# Patient Record
Sex: Female | Born: 1970 | Race: White | Hispanic: No | Marital: Married | Smoking: Never smoker
Health system: Southern US, Community
[De-identification: ages and names within clinical notes are randomized; demographics above are authoritative.]

## PROBLEM LIST (undated history)

## (undated) DIAGNOSIS — F419 Anxiety disorder, unspecified: Secondary | ICD-10-CM

## (undated) DIAGNOSIS — G4730 Sleep apnea, unspecified: Secondary | ICD-10-CM

## (undated) DIAGNOSIS — F329 Major depressive disorder, single episode, unspecified: Secondary | ICD-10-CM

## (undated) DIAGNOSIS — F32A Depression, unspecified: Secondary | ICD-10-CM

## (undated) DIAGNOSIS — N3010 Interstitial cystitis (chronic) without hematuria: Secondary | ICD-10-CM

## (undated) DIAGNOSIS — E039 Hypothyroidism, unspecified: Secondary | ICD-10-CM

## (undated) DIAGNOSIS — T8859XA Other complications of anesthesia, initial encounter: Secondary | ICD-10-CM

## (undated) DIAGNOSIS — F3181 Bipolar II disorder: Secondary | ICD-10-CM

## (undated) DIAGNOSIS — F909 Attention-deficit hyperactivity disorder, unspecified type: Secondary | ICD-10-CM

## (undated) DIAGNOSIS — J309 Allergic rhinitis, unspecified: Secondary | ICD-10-CM

## (undated) DIAGNOSIS — J45909 Unspecified asthma, uncomplicated: Secondary | ICD-10-CM

## (undated) HISTORY — PX: RADICAL HYSTERECTOMY: SHX2283

## (undated) HISTORY — DX: Bipolar II disorder: F31.81

## (undated) HISTORY — DX: Depression, unspecified: F32.A

## (undated) HISTORY — DX: Unspecified asthma, uncomplicated: J45.909

## (undated) HISTORY — PX: THYROIDECTOMY, PARTIAL: SHX18

## (undated) HISTORY — PX: LASIK: SHX215

## (undated) HISTORY — DX: Interstitial cystitis (chronic) without hematuria: N30.10

## (undated) HISTORY — PX: CYSTOCELE REPAIR: SHX163

## (undated) HISTORY — DX: Hypothyroidism, unspecified: E03.9

## (undated) HISTORY — DX: Allergic rhinitis, unspecified: J30.9

## (undated) HISTORY — DX: Major depressive disorder, single episode, unspecified: F32.9

## (undated) HISTORY — PX: VERTICAL BANDED GASTROPLASTY: SHX1102

---

## 2014-12-23 DIAGNOSIS — J45909 Unspecified asthma, uncomplicated: Secondary | ICD-10-CM | POA: Insufficient documentation

## 2014-12-23 DIAGNOSIS — E663 Overweight: Secondary | ICD-10-CM | POA: Insufficient documentation

## 2014-12-23 DIAGNOSIS — Z90710 Acquired absence of both cervix and uterus: Secondary | ICD-10-CM | POA: Insufficient documentation

## 2014-12-23 DIAGNOSIS — M545 Low back pain, unspecified: Secondary | ICD-10-CM | POA: Insufficient documentation

## 2014-12-23 DIAGNOSIS — Z86010 Personal history of colonic polyps: Secondary | ICD-10-CM | POA: Insufficient documentation

## 2014-12-23 DIAGNOSIS — K449 Diaphragmatic hernia without obstruction or gangrene: Secondary | ICD-10-CM | POA: Insufficient documentation

## 2014-12-23 DIAGNOSIS — Z9889 Other specified postprocedural states: Secondary | ICD-10-CM | POA: Insufficient documentation

## 2015-04-22 DIAGNOSIS — N3010 Interstitial cystitis (chronic) without hematuria: Secondary | ICD-10-CM | POA: Insufficient documentation

## 2016-01-13 DIAGNOSIS — M797 Fibromyalgia: Secondary | ICD-10-CM | POA: Insufficient documentation

## 2016-09-28 DIAGNOSIS — E890 Postprocedural hypothyroidism: Secondary | ICD-10-CM | POA: Insufficient documentation

## 2016-09-28 DIAGNOSIS — Z9884 Bariatric surgery status: Secondary | ICD-10-CM | POA: Insufficient documentation

## 2016-09-28 DIAGNOSIS — R6889 Other general symptoms and signs: Secondary | ICD-10-CM | POA: Insufficient documentation

## 2016-09-28 DIAGNOSIS — Z9889 Other specified postprocedural states: Secondary | ICD-10-CM | POA: Insufficient documentation

## 2017-12-29 ENCOUNTER — Other Ambulatory Visit: Payer: Self-pay | Admitting: Family Medicine

## 2017-12-30 ENCOUNTER — Other Ambulatory Visit: Payer: Self-pay | Admitting: Family Medicine

## 2017-12-30 DIAGNOSIS — N644 Mastodynia: Secondary | ICD-10-CM

## 2018-01-03 ENCOUNTER — Other Ambulatory Visit: Payer: Self-pay

## 2018-01-11 ENCOUNTER — Inpatient Hospital Stay: Admission: RE | Admit: 2018-01-11 | Payer: Self-pay | Source: Ambulatory Visit

## 2018-01-11 ENCOUNTER — Other Ambulatory Visit: Payer: Self-pay

## 2018-02-16 HISTORY — PX: CERVICAL DISC SURGERY: SHX588

## 2018-02-20 ENCOUNTER — Other Ambulatory Visit: Payer: Self-pay | Admitting: Family Medicine

## 2018-02-20 ENCOUNTER — Ambulatory Visit
Admission: RE | Admit: 2018-02-20 | Discharge: 2018-02-20 | Disposition: A | Discharge status: Home / Self Care | Payer: Managed Care, Other (non HMO) | Source: Ambulatory Visit | Attending: Family Medicine | Admitting: Family Medicine

## 2018-02-20 DIAGNOSIS — Z01818 Encounter for other preprocedural examination: Secondary | ICD-10-CM

## 2018-10-07 DIAGNOSIS — F988 Other specified behavioral and emotional disorders with onset usually occurring in childhood and adolescence: Secondary | ICD-10-CM | POA: Insufficient documentation

## 2018-10-07 DIAGNOSIS — F3132 Bipolar disorder, current episode depressed, moderate: Secondary | ICD-10-CM

## 2018-10-07 DIAGNOSIS — F4310 Post-traumatic stress disorder, unspecified: Secondary | ICD-10-CM

## 2018-10-16 ENCOUNTER — Ambulatory Visit: Payer: Self-pay | Admitting: Physician Assistant

## 2018-10-17 ENCOUNTER — Ambulatory Visit (INDEPENDENT_AMBULATORY_CARE_PROVIDER_SITE_OTHER): Payer: 59 | Admitting: Physician Assistant

## 2018-10-17 ENCOUNTER — Encounter: Payer: Self-pay | Admitting: Physician Assistant

## 2018-10-17 DIAGNOSIS — F900 Attention-deficit hyperactivity disorder, predominantly inattentive type: Secondary | ICD-10-CM | POA: Diagnosis not present

## 2018-10-17 DIAGNOSIS — F4310 Post-traumatic stress disorder, unspecified: Secondary | ICD-10-CM

## 2018-10-17 DIAGNOSIS — F319 Bipolar disorder, unspecified: Secondary | ICD-10-CM

## 2018-10-17 MED ORDER — BUPROPION HCL ER (XL) 300 MG PO TB24: 300.0000 mg | ORAL_TABLET | Freq: Every day | ORAL | 1 refills | Status: DC

## 2018-10-17 MED ORDER — OXCARBAZEPINE 300 MG PO TABS: ORAL_TABLET | ORAL | 1 refills | Status: DC

## 2018-10-17 MED ORDER — CITALOPRAM HYDROBROMIDE 40 MG PO TABS: 40.0000 mg | ORAL_TABLET | Freq: Every day | ORAL | 1 refills | Status: DC

## 2018-10-17 MED ORDER — AMPHETAMINE-DEXTROAMPHETAMINE 15 MG PO TABS: ORAL_TABLET | ORAL | 0 refills | Status: DC

## 2018-10-17 MED ORDER — LAMOTRIGINE 100 MG PO TABS: 100.0000 mg | ORAL_TABLET | Freq: Every day | ORAL | 1 refills | Status: DC

## 2018-10-17 NOTE — Patient Instructions (Signed)
Stop the Depakote  Start Trileptal (Oxcarbazepine) 300 mg at night for 4 nights, then one twice a day.  Increase Lamictal to 100mg  on 10/19/18  Increase the Adderall to 15 mg, 1 every morning and 1/2 in the afternoon if needed.   Continue the other meds the same.

## 2018-10-17 NOTE — Progress Notes (Signed)
[facility]  Patient ID: Kelli Pennington,  MRN: [id number]  PCP: Darrow Bussing, MD  Date of Evaluation: 10/17/2018 Time spent:15 minutes  Chief Complaint:  Chief Complaint    ADHD; Anxiety; Depression      HISTORY/CURRENT STATUS: HPIHere for 6 month med check.  Having trouble taking the Depakote. Pills are too big and they often get lodged in her throat.  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 She'll sometimes throw it up.  B/c she's had lap band surgery, she's unable to eat/drink a whole lot at once. She's tried the liquid in the past and it's nasty so won't go back to that.  Depakote sprinkles worked but she had to eat too much, so that's impossible.  Mood is worse than it was.  More tense and irritable, depressed at times.  She and husband just got back from [place] on vacation and enjoyed that. She'll avoid situations because she knows she's likely going to be irritable.  Energy and motivation are usually ok. But when has a depressed day, wants to go home and go to sleep.  At work, she stays to herself  And gets her work done.  She stayed home one day because she felt bad, which was partly depression.    Not really having PA but does have generalized anxiety.  Just feels hyper like she can't get past something and keeps thinking about it.   No SI/HI.  No AH/VH.  Past medications for mental health diagnoses include: Celexa, Wellbutrin, Klonopin, Prozac, Zoloft, Lexapro, Adderall, Lamictal, Depakote  Individual Medical History/ Review of Systems: Changes? :No   Allergies: Patient has no known allergies.  Current Medications:  Current Outpatient Medications:  .  buPROPion (WELLBUTRIN XL) 300 MG 24 hr tablet, Take 1 tablet (300 mg total) by mouth daily., Disp: 90 tablet, Rfl: 1 .  citalopram (CELEXA) 40 MG tablet, Take 1 tablet (40 mg total) by mouth daily., Disp: 90 tablet, Rfl: 1 .  amphetamine-dextroamphetamine (ADDERALL) 15 MG tablet, 1 po q am, and 1/2 in  the afternoon if needed., Disp: 45 tablet, Rfl: 0 .  [START ON 11/16/2018] amphetamine-dextroamphetamine (ADDERALL) 15 MG tablet, 1 po q am, and 1/2 in afternoon prn, Disp: 45 tablet, Rfl: 0 .  lamoTRIgine (LAMICTAL) 100 MG tablet, Take 1 tablet (100 mg total) by mouth daily., Disp: 30 tablet, Rfl: 1 .  Oxcarbazepine (TRILEPTAL) 300 MG tablet, 1 po q hs for 4 nights, then 1 po bid., Disp: 60 tablet, Rfl: 1 Medication Side Effects: none  Family Medical/ Social History: Changes? No  MENTAL HEALTH EXAM:  There were no vitals taken for this visit.There is no height or weight on file to calculate BMI.  General Appearance: Well Groomed  Eye Contact:  Good  Speech:  Clear and Coherent  Volume:  Normal  Mood:  Euthymic  Affect:  Appropriate  Thought Process:  Goal Directed  Orientation:  Full (Time, Place, and Person)  Thought Content: Logical   Suicidal Thoughts:  No  Homicidal Thoughts:  No  Memory:  WNL  Judgement:  Good  Insight:  Good  Psychomotor Activity:  Normal  Concentration:  Concentration: Good  Recall:  Good  Fund of Knowledge: Good  Language: Good  Assets:  Desire for Improvement  ADL's:  Intact  Cognition: WNL  Prognosis:  Good    DIAGNOSES:    ICD-10-CM   1. Bipolar I disorder (HCC) F31.9   2. PTSD (post-traumatic stress disorder) F43.10   3. Attention deficit hyperactivity  disorder (ADHD), predominantly inattentive type F90.0   Difficulty swallowing Depakote pills  Receiving Psychotherapy: No    RECOMMENDATIONS: Since she is having such a hard time swallowing the Depakote, and it sounds like she is not getting a consistent dose due to that fact, we will stop it and switch to Trileptal. Will increase Lamictal from 50 mg to 100 mg after she is off the Depakote for a couple of days. Continue Celexa. Increase Adderall dose as the current is not effective. Return in 6 weeks or sooner as needed.   Melony Overly, PA-C

## 2018-11-06 ENCOUNTER — Other Ambulatory Visit: Payer: Self-pay

## 2018-11-07 ENCOUNTER — Other Ambulatory Visit: Payer: Self-pay

## 2018-11-07 MED ORDER — DIVALPROEX SODIUM ER 500 MG PO TB24: 1000.0000 mg | ORAL_TABLET | Freq: Every day | ORAL | 1 refills | Status: DC

## 2018-11-12 ENCOUNTER — Other Ambulatory Visit: Payer: Self-pay

## 2018-11-12 MED ORDER — OXCARBAZEPINE 300 MG PO TABS: ORAL_TABLET | ORAL | 1 refills | Status: DC

## 2018-11-12 MED ORDER — LAMOTRIGINE 100 MG PO TABS: 100.0000 mg | ORAL_TABLET | Freq: Every day | ORAL | 1 refills | Status: DC

## 2018-11-28 ENCOUNTER — Ambulatory Visit (INDEPENDENT_AMBULATORY_CARE_PROVIDER_SITE_OTHER): Payer: 59 | Admitting: Physician Assistant

## 2018-11-28 ENCOUNTER — Encounter: Payer: Self-pay | Admitting: Physician Assistant

## 2018-11-28 DIAGNOSIS — F319 Bipolar disorder, unspecified: Secondary | ICD-10-CM

## 2018-11-28 DIAGNOSIS — F900 Attention-deficit hyperactivity disorder, predominantly inattentive type: Secondary | ICD-10-CM | POA: Diagnosis not present

## 2018-11-28 DIAGNOSIS — F4310 Post-traumatic stress disorder, unspecified: Secondary | ICD-10-CM | POA: Diagnosis not present

## 2018-11-28 MED ORDER — LAMOTRIGINE 100 MG PO TABS: 100.0000 mg | ORAL_TABLET | Freq: Every day | ORAL | 1 refills | Status: DC

## 2018-11-28 MED ORDER — BUPROPION HCL ER (XL) 300 MG PO TB24: 300.0000 mg | ORAL_TABLET | Freq: Every day | ORAL | 1 refills | Status: DC

## 2018-11-28 MED ORDER — CITALOPRAM HYDROBROMIDE 40 MG PO TABS: 40.0000 mg | ORAL_TABLET | Freq: Every day | ORAL | 1 refills | Status: DC

## 2018-11-28 MED ORDER — AMPHETAMINE-DEXTROAMPHETAMINE 20 MG PO TABS: ORAL_TABLET | ORAL | 0 refills | Status: DC

## 2018-11-28 MED ORDER — OXCARBAZEPINE 300 MG PO TABS: 300.0000 mg | ORAL_TABLET | Freq: Two times a day (BID) | ORAL | 1 refills | Status: DC

## 2018-11-28 NOTE — Progress Notes (Signed)
[facility]  Patient ID: Kelli Pennington,  MRN: [id number]  PCP: Darrow Bussing, MD  Date of Evaluation: 11/28/2018 Time spent:15 minutes  Chief Complaint:  Chief Complaint    Follow-up      HISTORY/CURRENT STATUS: HPI here for 6-week med check.  At last visit, we DC'd the Depakote and started Trileptal.  She is doing very well.  She is not gagging and having nausea secondary to taking those huge pills now.  Patient denies loss of interest in usual activities and is able to enjoy things.  Denies decreased energy or motivation.  Appetite has not changed.  No extreme sadness, tearfulness, or feelings of hopelessness.  Denies any changes in concentration, making decisions or remembering things.  Denies suicidal or homicidal thoughts.  Patient denies increased energy with decreased need for sleep, no increased talkativeness, no racing thoughts, no impulsivity or risky behaviors, no increased spending, no increased libido, no grandiosity.  As far as the ADHD goes, she used up the Adderall 20 mg that she has had in the past, taking that in the morning and then at 10 mg in the afternoon.  That seems to be the best coctail that she is done.  She is able to focus and complete task.  She is excited because she will be going on a cruise with a girlfriend in January.  States she is decided to take some time for herself and not worry about work so much.  Individual Medical History/ Review of Systems: Changes? :No   Allergies: Patient has no known allergies.  Current Medications:  Current Outpatient Medications:  .  buPROPion (WELLBUTRIN XL) 300 MG 24 hr tablet, Take 1 tablet (300 mg total) by mouth daily., Disp: 90 tablet, Rfl: 1 .  citalopram (CELEXA) 40 MG tablet, Take 1 tablet (40 mg total) by mouth daily., Disp: 90 tablet, Rfl: 1 .  lamoTRIgine (LAMICTAL) 100 MG tablet, Take 1 tablet (100 mg total) by mouth daily., Disp: 90 tablet, Rfl: 1 .  [START ON 12/28/2018]  amphetamine-dextroamphetamine (ADDERALL) 20 MG tablet, 1 po q am, 1/2 po in afternoon, Disp: 45 tablet, Rfl: 0 .  amphetamine-dextroamphetamine (ADDERALL) 20 MG tablet, 1 po q am, 1/2 q afternoon, Disp: 45 tablet, Rfl: 0 .  [START ON 01/27/2019] amphetamine-dextroamphetamine (ADDERALL) 20 MG tablet, 1 po q am, then 1/2 po q afternoon, Disp: 45 tablet, Rfl: 0 .  Oxcarbazepine (TRILEPTAL) 300 MG tablet, Take 1 tablet (300 mg total) by mouth 2 (two) times daily., Disp: 180 tablet, Rfl: 1 Medication Side Effects: none  Family Medical/ Social History: Changes? No  MENTAL HEALTH EXAM:  There were no vitals taken for this visit.There is no height or weight on file to calculate BMI.  General Appearance: Casual and Well Groomed  Eye Contact:  Good  Speech:  Clear and Coherent  Volume:  Normal  Mood:  Euthymic  Affect:  Appropriate  Thought Process:  Goal Directed  Orientation:  Full (Time, Place, and Person)  Thought Content: Logical   Suicidal Thoughts:  No  Homicidal Thoughts:  No  Memory:  WNL  Judgement:  Good  Insight:  Good  Psychomotor Activity:  Normal  Concentration:  Concentration: Good  Recall:  Good  Fund of Knowledge: Good  Language: Good  Assets:  Desire for Improvement  ADL's:  Intact  Cognition: WNL  Prognosis:  Good    DIAGNOSES:    ICD-10-CM   1. Bipolar I disorder (HCC) F31.9   2. PTSD (post-traumatic stress disorder) F43.10  3. Attention deficit hyperactivity disorder (ADHD), predominantly inattentive type F90.0     Receiving Psychotherapy: No    RECOMMENDATIONS: Continue Trileptal 300 mg p.o. twice daily Continue Adderall 20 mg in the a.m. and half p.o. at noon. Continue Lamictal 100 mg daily. Continue Celexa 40 mg daily. Continue Wellbutrin XL 1 daily. Return in 3 months or sooner as needed.   Melony Overlyeresa Soha Thorup, PA-C

## 2018-12-05 ENCOUNTER — Encounter: Payer: Self-pay | Admitting: Allergy

## 2018-12-05 ENCOUNTER — Ambulatory Visit (INDEPENDENT_AMBULATORY_CARE_PROVIDER_SITE_OTHER): Payer: 59 | Admitting: Allergy

## 2018-12-05 VITALS — BP 114/64 | HR 98 | Temp 99.0°F | Resp 20 | Ht 65.7 in | Wt 149.0 lb

## 2018-12-05 DIAGNOSIS — J3089 Other allergic rhinitis: Secondary | ICD-10-CM | POA: Diagnosis not present

## 2018-12-05 DIAGNOSIS — T781XXA Other adverse food reactions, not elsewhere classified, initial encounter: Secondary | ICD-10-CM | POA: Insufficient documentation

## 2018-12-05 DIAGNOSIS — T781XXD Other adverse food reactions, not elsewhere classified, subsequent encounter: Secondary | ICD-10-CM

## 2018-12-05 DIAGNOSIS — R059 Cough, unspecified: Secondary | ICD-10-CM

## 2018-12-05 DIAGNOSIS — R05 Cough: Secondary | ICD-10-CM | POA: Diagnosis not present

## 2018-12-05 DIAGNOSIS — T7819XA Other adverse food reactions, not elsewhere classified, initial encounter: Secondary | ICD-10-CM | POA: Insufficient documentation

## 2018-12-05 MED ORDER — FLUTICASONE PROPIONATE 50 MCG/ACT NA SUSP: NASAL | 5 refills | Status: AC

## 2018-12-05 MED ORDER — ALBUTEROL SULFATE HFA 108 (90 BASE) MCG/ACT IN AERS: 2.0000 | INHALATION_SPRAY | RESPIRATORY_TRACT | 1 refills | Status: AC | PRN

## 2018-12-05 MED ORDER — BUDESONIDE-FORMOTEROL FUMARATE 80-4.5 MCG/ACT IN AERO: 2.0000 | INHALATION_SPRAY | Freq: Two times a day (BID) | RESPIRATORY_TRACT | 5 refills | Status: AC

## 2018-12-05 NOTE — Patient Instructions (Addendum)
Today's testing showed: Positive to grass, dust mites, mold. Start Flonase 2 sprays once a day.  May use over the counter antihistamines such as Zyrtec (cetirizine), Claritin (loratadine), Allegra (fexofenadine), or Xyzal (levocetirizine) daily as needed.  Continue to avoid foods that cause you problems including the peanuts. Get bloodwork.  For mild symptoms you can take over the counter antihistamines such as Benadryl and monitor symptoms closely. If symptoms worsen or if you have severe symptoms including breathing issues, throat closure, significant swelling, whole body hives, severe diarrhea and vomiting, lightheadedness then seek immediate medical care.  . Daily controller medication(s): symbicort 80 2 puffs twice a day with spacer and rinse mouth afterwards. . Prior to physical activity: May use albuterol rescue inhaler 2 puffs 5 to 15 minutes prior to strenuous physical activities. Marland Kitchen. Rescue medications: May use albuterol rescue inhaler 2 puffs or nebulizer every 4 to 6 hours as needed for shortness of breath, chest tightness, coughing, and wheezing. Monitor frequency of use.  . Asthma control goals:  o Full participation in all desired activities (may need albuterol before activity) o Albuterol use two times or less a week on average (not counting use with activity) o Cough interfering with sleep two times or less a month o Oral steroids no more than once a year o No hospitalizations  Follow up in 2 months.  Reducing Pollen Exposure . Pollen seasons: trees (spring), grass (summer) and ragweed/weeds (fall). Marland Kitchen. Keep windows closed in your home and car to lower pollen exposure.  Kelli Pennington. Install air conditioning in the bedroom and throughout the house if possible.  . Avoid going out in dry windy days - especially early morning. . Pollen counts are highest between 5 - 10 AM and on dry, hot and windy days.  . Save outside activities for late afternoon or after a heavy rain, when pollen levels  are lower.  . Avoid mowing of grass if you have grass pollen allergy. Marland Kitchen. Be aware that pollen can also be transported indoors on people and pets.  . Dry your clothes in an automatic dryer rather than hanging them outside where they might collect pollen.  . Rinse hair and eyes before bedtime.  Control of House Dust Mite Allergen . Dust mite allergens are a common trigger of allergy and asthma symptoms. While they can be found throughout the house, these microscopic creatures thrive in warm, humid environments such as bedding, upholstered furniture and carpeting. . Because so much time is spent in the bedroom, it is essential to reduce mite levels there.  . Encase pillows, mattresses, and box springs in special allergen-proof fabric covers or airtight, zippered plastic covers.  . Bedding should be washed weekly in hot water (130 F) and dried in a hot dryer. Allergen-proof covers are available for comforters and pillows that can't be regularly washed.  Kelli Pennington. Wash the allergy-proof covers every few months. Minimize clutter in the bedroom. Keep pets out of the bedroom.  Marland Kitchen. Keep humidity less than 50% by using a dehumidifier or air conditioning. You can buy a humidity measuring device called a hygrometer to monitor this.  . If possible, replace carpets with hardwood, linoleum, or washable area rugs. If that's not possible, vacuum frequently with a vacuum that has a HEPA filter. . Remove all upholstered furniture and non-washable window drapes from the bedroom. . Remove all non-washable stuffed toys from the bedroom.  Wash stuffed toys weekly. Mold Control . Mold and fungi can grow on a variety of surfaces provided certain  temperature and moisture conditions exist.  . Outdoor molds grow on plants, decaying vegetation and soil. The major outdoor mold, Alternaria and Cladosporium, are found in very high numbers during hot and dry conditions. Generally, a late summer - fall peak is seen for common outdoor fungal  spores. Rain will temporarily lower outdoor mold spore count, but counts rise rapidly when the rainy period ends. . The most important indoor molds are Aspergillus and Penicillium. Dark, humid and poorly ventilated basements are ideal sites for mold growth. The next most common sites of mold growth are the bathroom and the kitchen. Outdoor (Seasonal) Mold Control . Use air conditioning and keep windows closed. . Avoid exposure to decaying vegetation. Marland Kitchen Avoid leaf raking. . Avoid grain handling. . Consider wearing a face mask if working in moldy areas.  Indoor (Perennial) Mold Control  . Maintain humidity below 50%. . Get rid of mold growth on hard surfaces with water, detergent and, if necessary, 5% bleach (do not mix with other cleaners). Then dry the area completely. If mold covers an area more than 10 square feet, consider hiring an indoor environmental professional. . For clothing, washing with soap and water is best. If moldy items cannot be cleaned and dried, throw them away. . Remove sources e.g. contaminated carpets. . Repair and seal leaking roofs or pipes. Using dehumidifiers in damp basements may be helpful, but empty the water and clean units regularly to prevent mildew from forming. All rooms, especially basements, bathrooms and kitchens, require ventilation and cleaning to deter mold and mildew growth. Avoid carpeting on concrete or damp floors, and storing items in damp areas. Pet Allergen Avoidance: . Contrary to popular opinion, there are no "hypoallergenic" breeds of dogs or cats. That is because people are not allergic to an animal's hair, but to an allergen found in the animal's saliva, dander (dead skin flakes) or urine. Pet allergy symptoms typically occur within minutes. For some people, symptoms can build up and become most severe 8 to 12 hours after contact with the animal. People with severe allergies can experience reactions in public places if dander has been transported on  the pet owners' clothing. Marland Kitchen Keeping an animal outdoors is only a partial solution, since homes with pets in the yard still have higher concentrations of animal allergens. . Before getting a pet, ask your allergist to determine if you are allergic to animals. If your pet is already considered part of your family, try to minimize contact and keep the pet out of the bedroom and other rooms where you spend a great deal of time. . As with dust mites, vacuum carpets often or replace carpet with a hardwood floor, tile or linoleum. . High-efficiency particulate air (HEPA) cleaners can reduce allergen levels over time. . While dander and saliva are the source of cat and dog allergens, urine is the source of allergens from rabbits, hamsters, mice and [place] pigs; so ask a non-allergic family member to clean the animal's cage. . If you have a pet allergy, talk to your allergist about the potential for allergy immunotherapy (allergy shots). This strategy can often provide long-term relief.

## 2018-12-05 NOTE — Progress Notes (Signed)
New Patient Note  RE: Kelli Pennington MRN: [id number] DOB: 11/16/1971 Date of Office Visit: 12/05/2018  Referring provider: Darrow Bussing, MD Primary care provider: Darrow Bussing, MD  Chief Complaint: Allergic Rhinitis ; Asthma (cough); and Allergic Reaction (feels sinus pressure when eating seafood and peanuts)  History of Present Illness: I had the pleasure of seeing Kelli Pennington for initial evaluation at the [facility] on 12/05/2018. She is a 47 y.o. female, who is referred here by Darrow Bussing, MD for the evaluation of asthma, allergic rhinitis, food intolerance.   Respiratory:  She reports symptoms of chest tightness, shortness of breath, coughing, wheezing, nocturnal awakenings for 15 years. Current medications include albuterol prn which help. She reports not using aerochamber with asthma inhalers. She tried the following inhalers: Advair Diskus many years ago with some benefit. Main asthma triggers are allergies, infections, smoke. In the last month, frequency of asthma symptoms: daily. Frequency of SABA use: daily. In the last 12 months, emergency room visits/urgent care visits/doctor office visits or hospitalizations due to asthma: no. In the last 12 months, oral steroids courses: no. Lifetime history of hospitalization for asthma: no. Prior intubations: no. History of pneumonia: yes, once in 2017. She was evaluated by allergist in the past. Smoking exposure: no. Up to date with flu vaccine: not yet.   Rhinitis: She reports symptoms of coughing, vertigo, nasal congestion, itchy/watery eyes. Symptoms have been going on for 40+ years. The symptoms are present all year around. Other triggers include exposure to pet dander. Anosmia: no. Headache: yes.  She has used OTC antihistamines, Flonase with minimal improvement in symptoms. Sinus infections: no. Previous work up includes: skin testing 15 years ago which showed multiple positives per patient report. She was on  allergy injections for about 1 year but she did not feel well on it so she stopped. Previous ENT evaluation: yes but not recently.  Food:  Patient states that when she eats peanuts it headaches and sinus tightness within a few minutes. Denies any other associated symptoms. Symptoms resolve within 1 day.  She does not have access to epinephrine autoinjector and not needed to use it.   Past work up includes: 15 year ago skin prick testing which showed positive to peanuts and certain fish per patient report Dietary History: patient has been eating other foods including 2% milk, eggs, rarely treenuts, sesame, shellfish, seafood, soy, wheat, meats, fruits and vegetables.  Assessment and Plan: [name] is a 47 y.o. female with: Coughing Respiratory symptoms for the past 15 years and has been using albuterol almost daily now with good benefit. No oral prednisone this year.   Today's spirometry showed: normal pattern with no improvement in FEV1 post bronchodilator treatment however clinically feeling improved.  Given clinical history will do a trial of Symbicort 80 2 puffs twice a day with spacer and rinse mouth afterwards. Demonstrated proper use and sample given. . Prior to physical activity: May use albuterol rescue inhaler 2 puffs 5 to 15 minutes prior to strenuous physical activities. Marland Kitchen Rescue medications: May use albuterol rescue inhaler 2 puffs or nebulizer every 4 to 6 hours as needed for shortness of breath, chest tightness, coughing, and wheezing. Monitor frequency of use.   Other allergic rhinitis Perennial rhinoconjunctivitis times the past 4 years.  Over-the-counter antihistamines and Flonase as needed with minimal benefit.  Testing over 15 years ago was positive to multiple items per patient report.  Also on allergy injections for about 1 year but had to  stop as she did not feel well on it.  No recent ENT evaluation.  Today's skin testing was positive to grass, dust mites and mold.   Discussed environmental control measures.  Start Flonase 2 sprays once a day.  May use over the counter antihistamines such as Zyrtec (cetirizine), Claritin (loratadine), Allegra (fexofenadine), or Xyzal (levocetirizine) daily as needed.  If above regimen does not control symptoms then will add Singulair as well.  Adverse food reaction Currently avoiding peanuts and certain seafood as they cause headaches and sinus tightness. Apparently 15 years ago she had skin testing was positive to these items. Records not available for review during OV.  Today's skin testing was negative to foods.  Discussed with patient that she may have some type of intolerance to these foods and recommend avoidance.  She would like to get bloodwork as well and ordered as below.  For mild symptoms you can take over the counter antihistamines such as Benadryl and monitor symptoms closely. If symptoms worsen or if you have severe symptoms including breathing issues, throat closure, significant swelling, whole body hives, severe diarrhea and vomiting, lightheadedness then seek immediate medical care.  Return in about 2 months (around 02/05/2019).  Meds ordered this encounter  Medications  . budesonide-formoterol (SYMBICORT) 80-4.5 MCG/ACT inhaler    Sig: Inhale 2 puffs into the lungs 2 (two) times daily. USE SPACER.    Dispense:  1 Inhaler    Refill:  5  . albuterol (PROVENTIL HFA;VENTOLIN HFA) 108 (90 Base) MCG/ACT inhaler    Sig: Inhale 2 puffs into the lungs every 4 (four) hours as needed for wheezing or shortness of breath.    Dispense:  18 g    Refill:  1  . fluticasone (FLONASE) 50 MCG/ACT nasal spray    Sig: Two sprays each nostril once a day as needed for nasal congestion or drainage.    Dispense:  16 g    Refill:  5    Lab Orders     Allergen Profile, Shellfish     Allergen Profile, Food-Fish     Peanut IgE     Allergen Profile, Nuts x7     Allergen Pistachio [phone]  Other allergy  screening: Medication allergy: no Hymenoptera allergy: no Urticaria: no Eczema:no History of recurrent infections suggestive of immunodeficency: no  Diagnostics: Spirometry:  Tracings reviewed. Her effort: Good reproducible efforts. FVC: 3.47L FEV1: 3.01L, 99% predicted FEV1/FVC ratio: 87% Interpretation: Spirometry consistent with normal pattern with no improvement in FEV1 post bronchodilator treatment however clinically feeling improved. Please see scanned spirometry results for details.  Skin Testing: Environmental allergy panel and select foods Positive test to: grass, dust mites, mold. Negative test to: foods.  Results discussed with patient/family. Airborne Adult Perc - 12/05/18 1459    Time Antigen Placed  1500    Allergen Manufacturer  Waynette Buttery    Location  Back    Number of Test  59    Panel 1  Select    1. Control-Buffer 50% Glycerol  Negative    2. Control-Histamine 1 mg/ml  3+    3. Albumin saline  Negative    4. [place]  4+    5. [place]  4+    6. Johnson  4+    7. [place] Blue  4+    8. Meadow Fescue  2+    9. Perennial Rye  4+    10. Sweet Vernal  4+    11. Timothy  4+    12. Cocklebur  Negative    13. Burweed Marshelder  Negative    14. Ragweed, short  Negative    15. Ragweed, Giant  Negative    16. Plantain,  English  Negative    17. Lamb's Quarters  Negative    18. Sheep Sorrell  Negative    19. Rough Pigweed  Negative    20. Marsh Elder, Rough  Negative    21. Mugwort, Common  Negative    22. Ash mix  Negative    23. Birch mix  Negative    24. Beech American  Negative    25. Box, Elder  Negative    26. Cedar, red  Negative    27. Cottonwood, Guinea-Bissau  Negative    28. Elm mix  Negative    29. Hickory mix  Negative    30. Maple mix  Negative    31. Oak, Guinea[place] mix  Negative    32. Pecan Pollen  Negative    33. Pine mix  Negative    34. Sycamore Eastern  Negative    35. Walnut, Black Pollen  Negative    36. Alternaria alternata  Negative    37.  Cladosporium Herbarum  Negative    38. Aspergillus mix  Negative    39. Penicillium mix  Negative    40. Bipolaris sorokiniana (Helminthosporium)  Negative    41. Drechslera spicifera (Curvularia)  Negative    42. Mucor plumbeus  Negative    43. Fusarium moniliforme  Negative    44. Aureobasidium pullulans (pullulara)  Negative    45. Rhizopus oryzae  Negative    46. Botrytis cinera  Negative    47. Epicoccum nigrum  Negative    48. Phoma betae  Negative    49. Candida Albicans  Negative    50. Trichophyton mentagrophytes  Negative    51. Mite, D Farinae  5,000 AU/ml  Negative    52. Mite, D Pteronyssinus  5,000 AU/ml  Negative    53. Cat Hair 10,000 BAU/ml  Negative    54.  Dog Epithelia  Negative    55. Mixed Feathers  Negative    56. Horse Epithelia  Negative    57. Cockroach, German  Negative    58. Mouse  Negative    59. Tobacco Leaf  Negative     Intradermal - 12/05/18 1538    Time Antigen Placed  1538    Allergen Manufacturer  Waynette Buttery    Location  Arm    Number of Test  12    Intradermal  Select    Control  Negative    Ragweed mix  Negative    Weed mix  Negative    Tree mix  Negative    Mold 1  Negative    Mold 2  2+    Mold 3  Negative    Mold 4  2+    Cat  Negative    Dog  Negative    Cockroach  Negative    Mite mix  2+     Food Adult Perc - 12/05/18 1500    Time Antigen Placed  1500    Allergen Manufacturer  Greer    Location  Back    Number of allergen test  30    1. Peanut  Negative    2. Soybean  Negative    3. Wheat  Negative    4. Sesame  Negative    5. Milk, cow  Negative    6. Egg [place],  Chicken  Negative    7. Casein  Negative    8. Shellfish Mix  Negative    9. Fish Mix  Negative    10. Cashew  Negative    11. Pecan Food  Negative    12. Walnut Food  Negative    13. Almond  Negative    14. Hazelnut  Negative    15. [place] nut  Negative    16. Coconut  Negative    17. Pistachio  Negative    18. Catfish  Negative    19. Bass  Negative      20. Trout  Negative    21. Tuna  Negative    22. Salmon  Negative    23. Flounder  Negative    24. Codfish  Negative    25. Shrimp  Negative    26. Crab  Negative    27. Lobster  Negative    28. Oyster  Negative    29. Scallops  Negative    3. Rabbit  Negative       Past Medical History: Patient Active Problem List   Diagnosis Date Noted  . Coughing 12/05/2018  . Other allergic rhinitis 12/05/2018  . Adverse food reaction 12/05/2018  . Posttraumatic stress disorder 10/07/2018  . Moderate depressed bipolar I disorder (HCC) 10/07/2018  . ADD (attention deficit disorder) 10/07/2018   Past Medical History:  Diagnosis Date  . Allergic rhinitis   . Asthma   . Bipolar 2 disorder (HCC)   . Depression   . Hypothyroidism   . Interstitial cystitis    Past Surgical History: Past Surgical History:  Procedure Laterality Date  . CERVICAL DISC SURGERY  02/2018  . RADICAL HYSTERECTOMY    . THYROIDECTOMY, PARTIAL    . VERTICAL BANDED GASTROPLASTY     Medication List:  Current Outpatient Medications  Medication Sig Dispense Refill  . albuterol (PROVENTIL HFA;VENTOLIN HFA) 108 (90 Base) MCG/ACT inhaler Inhale 2 puffs into the lungs every 4 (four) hours as needed for wheezing or shortness of breath. 18 g 1  . [START ON 12/28/2018] amphetamine-dextroamphetamine (ADDERALL) 20 MG tablet 1 po q am, 1/2 po in afternoon 45 tablet 0  . buPROPion (WELLBUTRIN XL) 300 MG 24 hr tablet Take 1 tablet (300 mg total) by mouth daily. 90 tablet 1  . citalopram (CELEXA) 40 MG tablet Take 1 tablet (40 mg total) by mouth daily. 90 tablet 1  . gabapentin (NEURONTIN) 300 MG capsule TAKE 1 CAPSULE BY MOUTH TWICE A DAY WITH FOOD  1  . lamoTRIgine (LAMICTAL) 100 MG tablet Take 1 tablet (100 mg total) by mouth daily. 90 tablet 1  . levothyroxine (SYNTHROID, LEVOTHROID) 88 MCG tablet TAKE 1 TABLET BY MOUTH EVERY DAY ON EMPTY STOMACH IN THE MORNING  2  . Oxcarbazepine (TRILEPTAL) 300 MG tablet Take 1 tablet (300  mg total) by mouth 2 (two) times daily. 180 tablet 1  . valACYclovir (VALTREX) 1000 MG tablet     . budesonide-formoterol (SYMBICORT) 80-4.5 MCG/ACT inhaler Inhale 2 puffs into the lungs 2 (two) times daily. USE SPACER. 1 Inhaler 5  . fluticasone (FLONASE) 50 MCG/ACT nasal spray Two sprays each nostril once a day as needed for nasal congestion or drainage. 16 g 5   No current facility-administered medications for this visit.    Allergies: No Known Allergies Social History: Social History   Socioeconomic History  . Marital status: Married    Spouse name: Not on file  . Number of children: Not  on file  . Years of education: Not on file  . Highest education level: Not on file  Occupational History  . Not on file  Social Needs  . Financial resource strain: Not on file  . Food insecurity:    Worry: Not on file    Inability: Not on file  . Transportation needs:    Medical: Not on file    Non-medical: Not on file  Tobacco Use  . Smoking status: Never Smoker  . Smokeless tobacco: Never Used  Substance and Sexual Activity  . Alcohol use: Yes    Comment: rare  . Drug use: Never  . Sexual activity: Not on file  Lifestyle  . Physical activity:    Days per week: Not on file    Minutes per session: Not on file  . Stress: Not on file  Relationships  . Social connections:    Talks on phone: Not on file    Gets together: Not on file    Attends religious service: Not on file    Active member of club or organization: Not on file    Attends meetings of clubs or organizations: Not on file    Relationship status: Not on file  Other Topics Concern  . Not on file  Social History Narrative  . Not on file   Lives in a 49 year old home. Smoking: denies Occupation: Biomedical scientist History: Immunologist in the house: no Engineer, civil (consulting) in the family room: no Carpet in the bedroom: yes Heating: electric Cooling: central Pet: yes 3 dogs indoors, 9 cat outdoors, 12  rabbits outdoors  Family History: Family History  Problem Relation Age of Onset  . COPD Mother   . Allergic rhinitis Sister   . Angioedema Neg Hx   . Asthma Neg Hx   . Eczema Neg Hx   . Immunodeficiency Neg Hx   . Urticaria Neg Hx    Review of Systems  Constitutional: Negative for appetite change, chills, fever and unexpected weight change.  HENT: Negative for congestion and rhinorrhea.   Eyes: Negative for itching.  Respiratory: Positive for cough, chest tightness, shortness of breath and wheezing.   Cardiovascular: Negative for chest pain.  Gastrointestinal: Positive for constipation. Negative for abdominal pain.  Genitourinary: Negative for difficulty urinating.  Skin: Negative for rash.  Allergic/Immunologic: Positive for environmental allergies and food allergies.  Neurological: Positive for headaches.   Objective: BP 114/64 (BP Location: Left Arm, Patient Position: Sitting, Cuff Size: Normal)   Pulse 98   Temp 99 F (37.2 C) (Oral)   Resp 20   Ht 5' 5.7" (1.669 m)   Wt 149 lb (67.6 kg)   SpO2 97%   BMI 24.27 kg/m  Body mass index is 24.27 kg/m. Physical Exam  Constitutional: She is oriented to person, place, and time. She appears well-developed and well-nourished.  HENT:  Head: Normocephalic and atraumatic.  Right Ear: External ear normal.  Left Ear: External ear normal.  Nose: Nose normal.  Mouth/Throat: Oropharynx is clear and moist.  Eyes: Conjunctivae and EOM are normal.  Neck: Neck supple.  Cardiovascular: Normal rate, regular rhythm and normal heart sounds. Exam reveals no gallop and no friction rub.  No murmur heard. Pulmonary/Chest: Effort normal and breath sounds normal. She has no wheezes. She has no rales.  Abdominal: Soft. Bowel sounds are normal. There is no abdominal tenderness.  Lymphadenopathy:    She has no cervical adenopathy.  Neurological: She is alert and oriented to person,  place, and time.  Skin: Skin is warm. No rash noted.    Psychiatric: She has a normal mood and affect. Her behavior is normal.  Nursing note and vitals reviewed.  The plan was reviewed with the patient/family, and all questions/concerned were addressed.  It was my pleasure to see Stanton KidneyDebra today and participate in her care. Please feel free to contact me with any questions or concerns.  Sincerely,  Wyline MoodYoon Kim, DO Allergy & Immunology  [facility] [place] office: [phone] [facility] office: [phone]

## 2018-12-05 NOTE — Assessment & Plan Note (Signed)
Respiratory symptoms for the past 15 years and has been using albuterol almost daily now with good benefit. No oral prednisone this year.   Today's spirometry showed: normal pattern with no improvement in FEV1 post bronchodilator treatment however clinically feeling improved.  Given clinical history will do a trial of Symbicort 80 2 puffs twice a day with spacer and rinse mouth afterwards. Demonstrated proper use and sample given. . Prior to physical activity: May use albuterol rescue inhaler 2 puffs 5 to 15 minutes prior to strenuous physical activities. Marland Kitchen. Rescue medications: May use albuterol rescue inhaler 2 puffs or nebulizer every 4 to 6 hours as needed for shortness of breath, chest tightness, coughing, and wheezing. Monitor frequency of use.

## 2018-12-05 NOTE — Assessment & Plan Note (Signed)
Currently avoiding peanuts and certain seafood as they cause headaches and sinus tightness. Apparently 15 years ago she had skin testing was positive to these items. Records not available for review during OV.  Today's skin testing was negative to foods.  Discussed with patient that she may have some type of intolerance to these foods and recommend avoidance.  She would like to get bloodwork as well and ordered as below.  For mild symptoms you can take over the counter antihistamines such as Benadryl and monitor symptoms closely. If symptoms worsen or if you have severe symptoms including breathing issues, throat closure, significant swelling, whole body hives, severe diarrhea and vomiting, lightheadedness then seek immediate medical care.

## 2018-12-05 NOTE — Assessment & Plan Note (Signed)
Perennial rhinoconjunctivitis times the past 4 years.  Over-the-counter antihistamines and Flonase as needed with minimal benefit.  Testing over 15 years ago was positive to multiple items per patient report.  Also on allergy injections for about 1 year but had to stop as she did not feel well on it.  No recent ENT evaluation.  Today's skin testing was positive to grass, dust mites and mold.  Discussed environmental control measures.  Start Flonase 2 sprays once a day.  May use over the counter antihistamines such as Zyrtec (cetirizine), Claritin (loratadine), Allegra (fexofenadine), or Xyzal (levocetirizine) daily as needed.  If above regimen does not control symptoms then will add Singulair as well.

## 2018-12-07 ENCOUNTER — Other Ambulatory Visit (HOSPITAL_COMMUNITY): Payer: Self-pay | Admitting: Interventional Radiology

## 2018-12-07 ENCOUNTER — Ambulatory Visit
Admission: RE | Admit: 2018-12-07 | Discharge: 2018-12-07 | Disposition: A | Discharge status: Home / Self Care | Payer: Self-pay | Source: Ambulatory Visit | Attending: Interventional Radiology | Admitting: Interventional Radiology

## 2018-12-07 DIAGNOSIS — R52 Pain, unspecified: Secondary | ICD-10-CM

## 2018-12-11 ENCOUNTER — Other Ambulatory Visit (HOSPITAL_COMMUNITY): Payer: Self-pay | Admitting: Interventional Radiology

## 2018-12-11 DIAGNOSIS — M5412 Radiculopathy, cervical region: Secondary | ICD-10-CM

## 2018-12-13 ENCOUNTER — Other Ambulatory Visit: Payer: Self-pay | Admitting: Radiology

## 2018-12-13 ENCOUNTER — Other Ambulatory Visit: Payer: Self-pay

## 2018-12-13 DIAGNOSIS — M47029 Vertebral artery compression syndromes, site unspecified: Secondary | ICD-10-CM

## 2018-12-14 ENCOUNTER — Ambulatory Visit (HOSPITAL_COMMUNITY)
Admission: RE | Admit: 2018-12-14 | Discharge: 2018-12-14 | Disposition: A | Discharge status: Home / Self Care | Payer: Managed Care, Other (non HMO) | Source: Ambulatory Visit | Attending: Interventional Radiology | Admitting: Interventional Radiology

## 2018-12-14 DIAGNOSIS — Z5309 Procedure and treatment not carried out because of other contraindication: Secondary | ICD-10-CM | POA: Insufficient documentation

## 2018-12-14 DIAGNOSIS — M5412 Radiculopathy, cervical region: Secondary | ICD-10-CM

## 2018-12-14 MED ORDER — SODIUM CHLORIDE 0.9 % IV SOLN: Freq: Once | INTRAVENOUS | Status: DC

## 2018-12-14 MED ORDER — LIDOCAINE-PRILOCAINE 2.5-2.5 % EX CREA
TOPICAL_CREAM | Freq: Once | CUTANEOUS | Status: DC
  Filled 2018-12-14: qty 5

## 2019-01-03 ENCOUNTER — Other Ambulatory Visit: Payer: Self-pay | Admitting: Radiology

## 2019-01-04 ENCOUNTER — Encounter (HOSPITAL_COMMUNITY): Payer: Self-pay

## 2019-01-04 ENCOUNTER — Ambulatory Visit (HOSPITAL_COMMUNITY): Admission: RE | Admit: 2019-01-04 | Payer: Managed Care, Other (non HMO) | Source: Ambulatory Visit

## 2019-01-14 ENCOUNTER — Other Ambulatory Visit: Payer: Self-pay | Admitting: Radiology

## 2019-01-15 ENCOUNTER — Ambulatory Visit (HOSPITAL_COMMUNITY)
Admission: RE | Admit: 2019-01-15 | Discharge: 2019-01-15 | Disposition: A | Discharge status: Home / Self Care | Payer: Managed Care, Other (non HMO) | Source: Ambulatory Visit | Attending: Interventional Radiology | Admitting: Interventional Radiology

## 2019-01-15 ENCOUNTER — Other Ambulatory Visit (HOSPITAL_COMMUNITY): Payer: Self-pay | Admitting: Interventional Radiology

## 2019-01-15 ENCOUNTER — Encounter (HOSPITAL_COMMUNITY): Payer: Self-pay

## 2019-01-15 DIAGNOSIS — M5412 Radiculopathy, cervical region: Secondary | ICD-10-CM

## 2019-01-15 DIAGNOSIS — F3181 Bipolar II disorder: Secondary | ICD-10-CM | POA: Insufficient documentation

## 2019-01-15 DIAGNOSIS — Z79890 Hormone replacement therapy (postmenopausal): Secondary | ICD-10-CM | POA: Insufficient documentation

## 2019-01-15 DIAGNOSIS — J45909 Unspecified asthma, uncomplicated: Secondary | ICD-10-CM | POA: Diagnosis not present

## 2019-01-15 DIAGNOSIS — Z79899 Other long term (current) drug therapy: Secondary | ICD-10-CM | POA: Insufficient documentation

## 2019-01-15 DIAGNOSIS — E039 Hypothyroidism, unspecified: Secondary | ICD-10-CM | POA: Insufficient documentation

## 2019-01-15 HISTORY — PX: IR US GUIDE VASC ACCESS RIGHT: IMG2390

## 2019-01-15 HISTORY — PX: IR ANGIO INTRA EXTRACRAN SEL COM CAROTID INNOMINATE BILAT MOD SED: IMG5360

## 2019-01-15 HISTORY — PX: IR ANGIO VERTEBRAL SEL VERTEBRAL BILAT MOD SED: IMG5369

## 2019-01-15 LAB — BASIC METABOLIC PANEL
Anion gap: 11 (ref 5–15)
BUN: 9 mg/dL (ref 6–20)
CO2: 26 mmol/L (ref 22–32)
Calcium: 9 mg/dL (ref 8.9–10.3)
Chloride: 101 mmol/L (ref 98–111)
Creatinine, Ser: 0.69 mg/dL (ref 0.44–1.00)
GFR calc Af Amer: >60 mL/min (ref >60)
Glucose, Bld: 90 mg/dL (ref 70–99)
Potassium: 4 mmol/L (ref 3.5–5.1)
Sodium: 138 mmol/L (ref 135–145)

## 2019-01-15 LAB — CBC
HCT: 37.6 % (ref 36.0–46.0)
Hemoglobin: 13 g/dL (ref 12.0–15.0)
MCH: 32 pg (ref 26.0–34.0)
MCHC: 34.6 g/dL (ref 30.0–36.0)
MCV: 92.6 fL (ref 80.0–100.0)
Platelets: 242 10*3/uL (ref 150–400)
RBC: 4.06 MIL/uL (ref 3.87–5.11)
RDW: 11.8 % (ref 11.5–15.5)
WBC: 5.1 10*3/uL (ref 4.0–10.5)
nRBC: 0 % (ref 0.0–0.2)

## 2019-01-15 LAB — PROTIME-INR
INR: 0.94
Prothrombin Time: 12.5 seconds (ref 11.4–15.2)

## 2019-01-15 MED ORDER — IOHEXOL 300 MG/ML  SOLN
150.0000 mL | Freq: Once | INTRAMUSCULAR | Status: AC | PRN
  Administered 2019-01-15: 75 mL via INTRA_ARTERIAL

## 2019-01-15 MED ORDER — FENTANYL CITRATE (PF) 100 MCG/2ML IJ SOLN
INTRAMUSCULAR | Status: AC | PRN
  Administered 2019-01-15 (×2): 12.5 ug via INTRAVENOUS
  Administered 2019-01-15: 25 ug via INTRAVENOUS

## 2019-01-15 MED ORDER — HEPARIN SODIUM (PORCINE) 1000 UNIT/ML IJ SOLN
INTRAMUSCULAR | Status: AC
  Filled 2019-01-15: qty 1

## 2019-01-15 MED ORDER — NITROGLYCERIN 1 MG/10 ML FOR IR/CATH LAB
INTRA_ARTERIAL | Status: AC
  Filled 2019-01-15: qty 10

## 2019-01-15 MED ORDER — MIDAZOLAM HCL 2 MG/2ML IJ SOLN
INTRAMUSCULAR | Status: AC | PRN
  Administered 2019-01-15 (×2): 0.5 mg via INTRAVENOUS

## 2019-01-15 MED ORDER — SODIUM CHLORIDE 0.9 % IV SOLN: INTRAVENOUS | Status: AC

## 2019-01-15 MED ORDER — LIDOCAINE HCL 1 % IJ SOLN
INTRAMUSCULAR | Status: AC
  Filled 2019-01-15: qty 20

## 2019-01-15 MED ORDER — FENTANYL CITRATE (PF) 100 MCG/2ML IJ SOLN
INTRAMUSCULAR | Status: AC
  Filled 2019-01-15: qty 2

## 2019-01-15 MED ORDER — IOPAMIDOL (ISOVUE-300) INJECTION 61%
INTRAVENOUS | Status: AC
  Filled 2019-01-15: qty 50

## 2019-01-15 MED ORDER — SODIUM CHLORIDE 0.9 % IV SOLN
INTRAVENOUS | Status: AC | PRN
  Administered 2019-01-15: 250 mL via INTRAVENOUS

## 2019-01-15 MED ORDER — VERAPAMIL HCL 2.5 MG/ML IV SOLN
INTRAVENOUS | Status: AC
  Filled 2019-01-15: qty 2

## 2019-01-15 MED ORDER — SODIUM CHLORIDE 0.9 % IV SOLN: Freq: Once | INTRAVENOUS | Status: DC

## 2019-01-15 MED ORDER — MIDAZOLAM HCL 2 MG/2ML IJ SOLN
INTRAMUSCULAR | Status: AC
  Filled 2019-01-15: qty 2

## 2019-01-15 NOTE — Discharge Instructions (Signed)
Moderate Conscious Sedation, Adult, Care After °These instructions provide you with information about caring for yourself after your procedure. Your health care provider may also give you more specific instructions. Your treatment has been planned according to current medical practices, but problems sometimes occur. Call your health care provider if you have any problems or questions after your procedure. °What can I expect after the procedure? °After your procedure, it is common: °· To feel sleepy for several hours. °· To feel clumsy and have poor balance for several hours. °· To have poor judgment for several hours. °· To vomit if you eat too soon. °Follow these instructions at home: °For at least 24 hours after the procedure: ° °· Do not: °? Participate in activities where you could fall or become injured. °? Drive. °? Use heavy machinery. °? Drink alcohol. °? Take sleeping pills or medicines that cause drowsiness. °? Make important decisions or sign legal documents. °? Take care of children on your own. °· Rest. °Eating and drinking °· Follow the diet recommended by your health care provider. °· If you vomit: °? Drink water, juice, or soup when you can drink without vomiting. °? Make sure you have little or no nausea before eating solid foods. °General instructions °· Have a responsible adult stay with you until you are awake and alert. °· Take over-the-counter and prescription medicines only as told by your health care provider. °· If you smoke, do not smoke without supervision. °· Keep all follow-up visits as told by your health care provider. This is important. °Contact a health care provider if: °· You keep feeling nauseous or you keep vomiting. °· You feel light-headed. °· You develop a rash. °· You have a fever. °Get help right away if: °· You have trouble breathing. °This information is not intended to replace advice given to you by your health care provider. Make sure you discuss any questions you have  with your health care provider. °Document Released: 09/25/2013 Document Revised: 05/09/2016 Document Reviewed: 03/26/2016 °Elsevier Interactive Patient Education © 2019 [employer] °Radial Site Care ° °This sheet gives you information about how to care for yourself after your procedure. Your health care provider may also give you more specific instructions. If you have problems or questions, contact your health care provider. °What can I expect after the procedure? °After the procedure, it is common to have: °· Bruising and tenderness at the catheter insertion area. °Follow these instructions at home: °Medicines °· Take over-the-counter and prescription medicines only as told by your health care provider. °Insertion site care °· Follow instructions from your health care provider about how to take care of your insertion site. Make sure you: °? Wash your hands with soap and water before you change your bandage (dressing). If soap and water are not available, use hand sanitizer. °? Change your dressing as told by your health care provider. °? Leave stitches (sutures), skin glue, or adhesive strips in place. These skin closures may need to stay in place for 2 weeks or longer. If adhesive strip edges start to loosen and curl up, you may trim the loose edges. Do not remove adhesive strips completely unless your health care provider tells you to do that. °· Check your insertion site every day for signs of infection. Check for: °? Redness, swelling, or pain. °? Fluid or blood. °? Pus or a bad smell. °? Warmth. °· Do not take baths, swim, or use a hot tub until your health care provider approves. °· You   may shower 24-48 hours after the procedure, or as directed by your health care provider. °? Remove the dressing and gently wash the site with plain soap and water. °? Pat the area dry with a clean towel. °? Do not rub the site. That could cause bleeding. °· Do not apply powder or lotion to the site. °Activity ° °· For 24  hours after the procedure, or as directed by your health care provider: °? Do not flex or bend the affected arm. °? Do not push or pull heavy objects with the affected arm. °? Do not drive yourself home from the hospital or clinic. You may drive 24 hours after the procedure unless your health care provider tells you not to. °? Do not operate machinery or power tools. °· Do not lift anything that is heavier than 10 lb (4.5 kg), or the limit that you are told, until your health care provider says that it is safe. °· Ask your health care provider when it is okay to: °? Return to work or school. °? Resume usual physical activities or sports. °? Resume sexual activity. °General instructions °· If the catheter site starts to bleed, raise your arm and put firm pressure on the site. If the bleeding does not stop, get help right away. This is a medical emergency. °· If you went home on the same day as your procedure, a responsible adult should be with you for the first 24 hours after you arrive home. °· Keep all follow-up visits as told by your health care provider. This is important. °Contact a health care provider if: °· You have a fever. °· You have redness, swelling, or yellow drainage around your insertion site. °Get help right away if: °· You have unusual pain at the radial site. °· The catheter insertion area swells very fast. °· The insertion area is bleeding, and the bleeding does not stop when you hold steady pressure on the area. °· Your arm or hand becomes pale, cool, tingly, or numb. °These symptoms may represent a serious problem that is an emergency. Do not wait to see if the symptoms will go away. Get medical help right away. Call your local emergency services (911 in the [place] Do not drive yourself to the hospital. °Summary °· After the procedure, it is common to have bruising and tenderness at the site. °· Follow instructions from your health care provider about how to take care of your radial site wound.  Check the wound every day for signs of infection. °· Do not lift anything that is heavier than 10 lb (4.5 kg), or the limit that you are told, until your health care provider says that it is safe. °This information is not intended to replace advice given to you by your health care provider. Make sure you discuss any questions you have with your health care provider. °Document Released: 01/07/2011 Document Revised: 01/10/2018 Document Reviewed: 01/10/2018 °Elsevier Interactive Patient Education © 2019 Else[employer] Inc[employer] ° °

## 2019-01-15 NOTE — H&P (Signed)
Chief Complaint: Cervical radiculopathy  Referring Physician(s): Glynis SmilesAmanda Ward, PA-C  Supervising Physician: Julieanne Cottoneveshwar, Sanjeev  [facility]  History of Present Illness: Kelli GuardDebra Pennington is a 48 y.o. female who is being worked up for cervical radiculopathy.  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  She is here today for diagnostic cerebral angiography.  She is NPO. She feels well today. No nausea/vomiting. No Fever/chills. ROS negative.    Past Medical History:  Diagnosis Date  . Allergic rhinitis   . Asthma   . Bipolar 2 disorder (HCC)   . Depression   . Hypothyroidism   . Interstitial cystitis     Past Surgical History:  Procedure Laterality Date  . CERVICAL DISC SURGERY  02/2018  . RADICAL HYSTERECTOMY    . THYROIDECTOMY, PARTIAL    . VERTICAL BANDED GASTROPLASTY      Allergies: Patient has no known allergies.  Medications: Prior to Admission medications   Medication Sig Start Date End Date Taking? Authorizing Provider  albuterol (PROVENTIL HFA;VENTOLIN HFA) 108 (90 Base) MCG/ACT inhaler Inhale 2 puffs into the lungs every 4 (four) hours as needed for wheezing or shortness of breath. 12/05/18  Yes Ellamae SiaKim, Yoon M, DO  amphetamine-dextroamphetamine (ADDERALL) 20 MG tablet 1 po q am, 1/2 po in afternoon Patient taking differently: Take 10-20 mg by mouth See admin instructions. 20 mg in the morning, and 10 mg in the afternoon if needed 12/28/18  Yes Hurst, Teresa T, PA-C  budesonide-formoterol (SYMBICORT) 80-4.5 MCG/ACT inhaler Inhale 2 puffs into the lungs 2 (two) times daily. USE SPACER. Patient taking differently: Inhale 2 puffs into the lungs 2 (two) times daily as needed (asthma). USE SPACER. 12/05/18  Yes Ellamae SiaKim, Yoon M, DO  buPROPion  (WELLBUTRIN XL) 300 MG 24 hr tablet Take 1 tablet (300 mg total) by mouth daily. 11/28/18  Yes Hurst, Glade Nurseeresa T, PA-C  citalopram (CELEXA) 40 MG tablet Take 1 tablet (40 mg total) by mouth daily. 11/28/18  Yes Hurst, Teresa T, PA-C  fluticasone (FLONASE) 50 MCG/ACT nasal spray Two sprays each nostril once a day as needed for nasal congestion or drainage. 12/05/18  Yes Ellamae SiaKim, Yoon M, DO  gabapentin (NEURONTIN) 300 MG capsule Take 300 mg by mouth 2 (two) times daily.  10/23/18  Yes [provider]  lamoTRIgine (LAMICTAL) 100 MG tablet Take 1 tablet (100 mg total) by mouth daily. Patient taking differently: Take 100 mg by mouth at bedtime.  11/28/18  Yes Melony OverlyHurst, Teresa T, PA-C  levothyroxine (SYNTHROID, LEVOTHROID) 88 MCG tablet Take 88 mcg by mouth daily before breakfast.  10/05/18  Yes [provider]  Oxcarbazepine (TRILEPTAL) 300 MG tablet Take 1 tablet (300 mg total) by mouth 2 (two) times daily. Patient taking differently: Take 600 mg by mouth at bedtime.  11/28/18  Yes Hurst, Glade Nurseeresa T, PA-C  valACYclovir (VALTREX) 1000 MG tablet Take 1,000 mg by mouth daily as needed.  11/27/18  Yes [provider]     Family History  Problem Relation Age of Onset  . COPD Mother   . Allergic rhinitis Sister   . Angioedema Neg Hx   . Asthma Neg Hx   . Eczema Neg Hx   . Immunodeficiency Neg Hx   . Urticaria Neg Hx     Social History   Socioeconomic  History  . Marital status: Married    Spouse name: Not on file  . Number of children: Not on file  . Years of education: Not on file  . Highest education level: Not on file  Occupational History  . Not on file  Social Needs  . Financial resource strain: Not on file  . Food insecurity:    Worry: Not on file    Inability: Not on file  . Transportation needs:    Medical: Not on file    Non-medical: Not on file  Tobacco Use  . Smoking status: Never Smoker  . Smokeless tobacco: Never Used  Substance and Sexual Activity  .  Alcohol use: Yes    Comment: rare  . Drug use: Never  . Sexual activity: Not on file  Lifestyle  . Physical activity:    Days per week: Not on file    Minutes per session: Not on file  . Stress: Not on file  Relationships  . Social connections:    Talks on phone: Not on file    Gets together: Not on file    Attends religious service: Not on file    Active member of club or organization: Not on file    Attends meetings of clubs or organizations: Not on file    Relationship status: Not on file  Other Topics Concern  . Not on file  Social History Narrative  . Not on file     Review of Systems: A 12 point ROS discussed and pertinent positives are indicated in the HPI above.  All other systems are negative.  Review of Systems  Vital Signs: BP 120/79   Pulse 81   Temp 97.9 F (36.6 C) (Oral)   Resp 16   Ht 5' 5.5" (1.664 m)   Wt 63.5 kg   LMP 12/14/2018 Comment: hysterectomy  SpO2 100%   BMI 22.94 kg/m   Physical Exam Vitals signs reviewed.  Constitutional:      Appearance: Normal appearance.  HENT:     Head: Normocephalic and atraumatic.  Eyes:     Extraocular Movements: Extraocular movements intact.  Neck:     Musculoskeletal: Normal range of motion.  Cardiovascular:     Rate and Rhythm: Normal rate and regular rhythm.  Pulmonary:     Effort: Pulmonary effort is normal.     Breath sounds: Normal breath sounds.  Abdominal:     Palpations: Abdomen is soft.  Musculoskeletal: Normal range of motion.  Skin:    General: Skin is warm and dry.  Neurological:     General: No focal deficit present.     Mental Status: She is alert and oriented to person, place, and time.  Psychiatric:        Mood and Affect: Mood normal.        Behavior: Behavior normal.        Thought Content: Thought content normal.        Judgment: Judgment normal.     Imaging: No results found.  Labs:  CBC: Recent Labs    01/15/19 0648  WBC 5.1  HGB 13.0  HCT 37.6  PLT 242     COAGS: Recent Labs    01/15/19 0648  INR 0.94    BMP: Recent Labs    01/15/19 0648  NA 138  K 4.0  CL 101  CO2 26  GLUCOSE 90  BUN 9  CALCIUM 9.0  CREATININE 0.69  GFRNONAA >60  GFRAA >60    LIVER  FUNCTION TESTS: No results for input(s): BILITOT, AST, ALT, ALKPHOS, PROT, ALBUMIN in the last 8760 hours.  TUMOR MARKERS: No results for input(s): AFPTM, CEA, CA199, CHROMGRNA in the last 8760 hours.  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  Will proceed with diagnostic angiography today by Dr. Corliss Skains.  Risks and benefits of cerebral angiogram with intervention were discussed with the patient including, but not limited to bleeding, infection, vascular injury, contrast induced renal failure, stroke or even death.  This interventional procedure involves the use of X-rays and because of the nature of the planned procedure, it is possible that we will have prolonged use of X-ray fluoroscopy.  Potential radiation risks to you include (but are not limited to) the following: - A slightly elevated risk for cancer  several years later in life. This risk is typically less than 0.5% percent. This risk is low in comparison to the normal incidence of human cancer, which is 33% for women and 50% for men according to the [employer]. - Radiation induced injury can include skin redness, resembling a rash, tissue breakdown / ulcers and hair loss (which can be temporary or permanent).   The likelihood of either of these occurring depends on the difficulty of the procedure and whether you are sensitive to radiation due to previous procedures, disease, or genetic conditions.   IF your procedure requires a prolonged use of radiation, you will be notified and given written  instructions for further action.  It is your responsibility to monitor the irradiated area for the 2 weeks following the procedure and to notify your physician if you are concerned that you have suffered a radiation induced injury.    All of the patient's questions were answered, patient is agreeable to proceed.  Consent signed and in chart.  Thank you for this interesting consult.  I greatly enjoyed meeting Artemisa Mustafa and look forward to participating in their care.  A copy of this report was sent to the requesting provider on this date.  Electronically Signed: Gwynneth Macleod, PA-C   01/15/2019, 7:50 AM      I spent a total of  30 Minutes in face to face in clinical consultation, greater than 50% of which was counseling/coordinating care for cerebral angiography.

## 2019-01-15 NOTE — Procedures (Signed)
S/P 4 vessel cerebral arteriogram RT CFA approach  Findings. 1.No angio evidence of occlusions,stenosis ,intraluminal filling defects,AV shunting  or dissection.

## 2019-01-16 ENCOUNTER — Encounter (HOSPITAL_COMMUNITY): Payer: Self-pay | Admitting: Interventional Radiology

## 2019-01-17 ENCOUNTER — Encounter (HOSPITAL_COMMUNITY): Payer: Self-pay

## 2019-01-17 ENCOUNTER — Encounter: Payer: Managed Care, Other (non HMO) | Admitting: Vascular Surgery

## 2019-01-17 ENCOUNTER — Encounter (HOSPITAL_COMMUNITY): Payer: Managed Care, Other (non HMO)

## 2019-01-17 ENCOUNTER — Other Ambulatory Visit (HOSPITAL_COMMUNITY): Payer: Self-pay | Admitting: Interventional Radiology

## 2019-01-17 DIAGNOSIS — M5412 Radiculopathy, cervical region: Secondary | ICD-10-CM

## 2019-01-28 ENCOUNTER — Telehealth: Payer: Self-pay | Admitting: Physician Assistant

## 2019-01-28 NOTE — Telephone Encounter (Signed)
Have her increase Lamictal to 150mg  qhs until we see each other. Stay on Trileptal for now.

## 2019-01-28 NOTE — Telephone Encounter (Signed)
Pt reports that Oxycarbabmezepine is not helping with depression. She is taking it as prescribed but notices that depression has gotten worse and notices more ups and downs.

## 2019-01-28 NOTE — Telephone Encounter (Signed)
Please review message

## 2019-01-29 NOTE — Telephone Encounter (Signed)
Pt given information and verbalized understanding. Instructed to call back when needing a refill of medication.

## 2019-02-08 ENCOUNTER — Ambulatory Visit: Payer: 59 | Admitting: Allergy

## 2019-02-27 ENCOUNTER — Ambulatory Visit: Payer: 59 | Admitting: Physician Assistant

## 2019-03-13 ENCOUNTER — Encounter: Payer: Self-pay | Admitting: Physician Assistant

## 2019-03-13 ENCOUNTER — Ambulatory Visit (INDEPENDENT_AMBULATORY_CARE_PROVIDER_SITE_OTHER): Payer: 59 | Admitting: Physician Assistant

## 2019-03-13 ENCOUNTER — Other Ambulatory Visit: Payer: Self-pay

## 2019-03-13 DIAGNOSIS — F900 Attention-deficit hyperactivity disorder, predominantly inattentive type: Secondary | ICD-10-CM

## 2019-03-13 DIAGNOSIS — F319 Bipolar disorder, unspecified: Secondary | ICD-10-CM | POA: Diagnosis not present

## 2019-03-13 DIAGNOSIS — F4310 Post-traumatic stress disorder, unspecified: Secondary | ICD-10-CM | POA: Diagnosis not present

## 2019-03-13 MED ORDER — AMPHETAMINE-DEXTROAMPHETAMINE 20 MG PO TABS: 20.0000 mg | ORAL_TABLET | Freq: Two times a day (BID) | ORAL | 0 refills | Status: DC

## 2019-03-13 NOTE — Progress Notes (Signed)
[facility] Check  Patient ID: Kelli Pennington,  MRN: [id number]  PCP: Darrow Bussing, MD  Date of Evaluation: 03/13/2019 Time spent:15 minutes  Chief Complaint:  Chief Complaint    Anxiety; Follow-up; Depression     Virtual Visit via Telephone Note  I connected with Kelli Pennington on 03/13/19 at  9:00 AM EDT by telephone and verified that I am speaking with the correct person using two identifiers.   I discussed the limitations, risks, security and privacy concerns of performing an evaluation and management service by telephone and the availability of in person appointments. I also discussed with the patient that there may be a patient responsible charge related to this service. The patient expressed understanding and agreed to proceed.   The patient was advised to call back or seek an in-person evaluation if the symptoms worsen or if the condition fails to improve as anticipated.  I provided 15 minutes of non-face-to-face time during this encounter.   Melony Overly, PA-C   HISTORY/CURRENT STATUS: HPI For 3 month med check.  Feels more depressed lately.  Can hit suddenly, and associated w/ bad anxiety. A few weeks ago, was in [place] for work and got upset about something and couldn't calm down for about 3 days.  No PA, but felt uneasy. Depression can last only a few hours or a day or so and usually not triggered.  No SI/HI.  Has low energy and motivation worse in the afternoon, and "now that I think about it, the depression hits in the afternoon as well.  It is almost like the Adderall is not working as well anymore."  Reports decreased concentration is in the afternoon so she pushes herself to get more things done in the morning, which causes more anxiety.  Patient denies increased energy with decreased need for sleep, no increased talkativeness, no racing thoughts, no impulsivity or risky behaviors, no increased spending, no increased libido, no grandiosity.  States she had  stopped the gabapentin because she was nervous about long-term effects.  She has noticed that she is having more pain from fibromyalgia and with her neck and back, which the gabapentin was originally prescribed for.  Denies muscle or joint pain, stiffness, or dystonia.  Denies dizziness, syncope, seizures, numbness, tingling, tremor, tics, unsteady gait, slurred speech, confusion.   Individual Medical History/ Review of Systems: Changes? :No    Past medications for mental health diagnoses include: Depakote, Celexa, Wellbutrin, Klonopin, Prozac, Zoloft, Lexapro, Adderall  Allergies: Patient has no known allergies.  Current Medications:  Current Outpatient Medications:  .  albuterol (PROVENTIL HFA;VENTOLIN HFA) 108 (90 Base) MCG/ACT inhaler, Inhale 2 puffs into the lungs every 4 (four) hours as needed for wheezing or shortness of breath., Disp: 18 g, Rfl: 1 .  budesonide-formoterol (SYMBICORT) 80-4.5 MCG/ACT inhaler, Inhale 2 puffs into the lungs 2 (two) times daily. USE SPACER. (Patient taking differently: Inhale 2 puffs into the lungs 2 (two) times daily as needed (asthma). USE SPACER.), Disp: 1 Inhaler, Rfl: 5 .  buPROPion (WELLBUTRIN XL) 300 MG 24 hr tablet, Take 1 tablet (300 mg total) by mouth daily., Disp: 90 tablet, Rfl: 1 .  citalopram (CELEXA) 40 MG tablet, Take 1 tablet (40 mg total) by mouth daily., Disp: 90 tablet, Rfl: 1 .  fluticasone (FLONASE) 50 MCG/ACT nasal spray, Two sprays each nostril once a day as needed for nasal congestion or drainage., Disp: 16 g, Rfl: 5 .  lamoTRIgine (LAMICTAL) 100 MG tablet, Take 1 tablet (100 mg total) by mouth  daily. (Patient taking differently: Take 150 mg by mouth at bedtime. ), Disp: 90 tablet, Rfl: 1 .  levothyroxine (SYNTHROID, LEVOTHROID) 88 MCG tablet, Take 88 mcg by mouth daily before breakfast. , Disp: , Rfl: 2 .  Oxcarbazepine (TRILEPTAL) 300 MG tablet, Take 1 tablet (300 mg total) by mouth 2 (two) times daily. (Patient taking differently:  Take 600 mg by mouth at bedtime. ), Disp: 180 tablet, Rfl: 1 .  valACYclovir (VALTREX) 1000 MG tablet, Take 1,000 mg by mouth daily as needed. , Disp: , Rfl:  .  amphetamine-dextroamphetamine (ADDERALL) 20 MG tablet, Take 1 tablet (20 mg total) by mouth 2 (two) times daily., Disp: 60 tablet, Rfl: 0 .  gabapentin (NEURONTIN) 300 MG capsule, Take 300 mg by mouth 2 (two) times daily. , Disp: , Rfl: 1 Medication Side Effects: none  Family Medical/ Social History: Changes? No  MENTAL HEALTH EXAM:  Last menstrual period 12/14/2018.There is no height or weight on file to calculate BMI.  General Appearance: Visit was on phone  Eye Contact:  phone visit  Speech:  Clear and Coherent  Volume:  Normal  Mood:  Euthymic  Affect:  unable to assess  Thought Process:  Goal Directed  Orientation:  Full (Time, Place, and Person)  Thought Content: Logical   Suicidal Thoughts:  No  Homicidal Thoughts:  No  Memory:  WNL  Judgement:  Good  Insight:  Good  Psychomotor Activity:  unable to assess  Concentration:  Concentration: Good and Attention Span: Good  Recall:  Good  Fund of Knowledge: Good  Language: Good  Assets:  Desire for Improvement  ADL's:  Intact  Cognition: WNL  Prognosis:  Good    DIAGNOSES:    ICD-10-CM   1. Bipolar I disorder (HCC) F31.9   2. PTSD (post-traumatic stress disorder) F43.10   3. Attention deficit hyperactivity disorder (ADHD), predominantly inattentive type F90.0     Receiving Psychotherapy: No    RECOMMENDATIONS: We discussed different options. Increase Adderall to 20 mg 1 p.o. every morning entheses which she has been doing already) and increase afternoon dose to 20 mg, she had been doing 10. Continue Wellbutrin XL 300 mg every morning.  If she is not better with the increase in Adderall within the next 2 weeks, she can call and we will increase the Wellbutrin dose. Continue Celexa 40 mg daily. Continue Lamictal 150 mg p.o. daily. Continue Trileptal 300 mg  twice daily. Restart gabapentin 300 mg twice daily. Return in 1 month.   Melony Overly, PA-C   This record has been created using [employer].  Chart creation errors have been sought, but may not always have been located and corrected. Such creation errors do not reflect on the standard of medical care.

## 2019-04-17 ENCOUNTER — Telehealth: Payer: Self-pay | Admitting: Physician Assistant

## 2019-04-17 ENCOUNTER — Other Ambulatory Visit: Payer: Self-pay

## 2019-04-17 MED ORDER — AMPHETAMINE-DEXTROAMPHETAMINE 20 MG PO TABS: 20.0000 mg | ORAL_TABLET | Freq: Two times a day (BID) | ORAL | 0 refills | Status: DC

## 2019-04-17 NOTE — Telephone Encounter (Signed)
Kelli Pennington scheduled appt for Friday, 04/19/19.  But she needs a refill on her Adderall.  Please sent to [facility]

## 2019-04-17 NOTE — Telephone Encounter (Signed)
Submitted

## 2019-04-19 ENCOUNTER — Ambulatory Visit (INDEPENDENT_AMBULATORY_CARE_PROVIDER_SITE_OTHER): Payer: 59 | Admitting: Physician Assistant

## 2019-04-19 ENCOUNTER — Other Ambulatory Visit: Payer: Self-pay

## 2019-04-19 ENCOUNTER — Encounter: Payer: Self-pay | Admitting: Physician Assistant

## 2019-04-19 DIAGNOSIS — F319 Bipolar disorder, unspecified: Secondary | ICD-10-CM

## 2019-04-19 DIAGNOSIS — F900 Attention-deficit hyperactivity disorder, predominantly inattentive type: Secondary | ICD-10-CM

## 2019-04-19 DIAGNOSIS — F4310 Post-traumatic stress disorder, unspecified: Secondary | ICD-10-CM | POA: Diagnosis not present

## 2019-04-19 MED ORDER — AMPHETAMINE-DEXTROAMPHETAMINE 20 MG PO TABS: 20.0000 mg | ORAL_TABLET | Freq: Two times a day (BID) | ORAL | 0 refills | Status: DC

## 2019-04-19 MED ORDER — GABAPENTIN 300 MG PO CAPS: 300.0000 mg | ORAL_CAPSULE | Freq: Two times a day (BID) | ORAL | 0 refills | Status: DC

## 2019-04-19 NOTE — Progress Notes (Signed)
[facility]  Patient ID: Kelli Pennington,  MRN: [id number]  PCP: Darrow Bussing, MD  Date of Evaluation: 04/19/2019 Time spent:15 minutes  Chief Complaint:  Chief Complaint    Follow-up     Virtual Visit via Telephone Note  I connected with patient by a video enabled telemedicine application or telephone, with their informed consent, and verified patient privacy and that I am speaking with the correct person using two identifiers.  I am private, in my home and the patient is at work.   I discussed the limitations, risks, security and privacy concerns of performing an evaluation and management service by telephone and the availability of in person appointments. I also discussed with the patient that there may be a patient responsible charge related to this service. The patient expressed understanding and agreed to proceed.   I discussed the assessment and treatment plan with the patient. The patient was provided an opportunity to ask questions and all were answered. The patient agreed with the plan and demonstrated an understanding of the instructions.   The patient was advised to call back or seek an in-person evaluation if the symptoms worsen or if the condition fails to improve as anticipated.  I provided 15 minutes of non-face-to-face time during this encounter.  HISTORY/CURRENT STATUS: HPI For routine med check.  Has been a little overwhelmed with stress at work.  Frustrated b/c other people don't do their job and then she can't do hers. But other than that, states she's okay.  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   Patient denies loss of interest in usual activities and is able to enjoy things.  Denies decreased energy or motivation.  Appetite has not changed.  No extreme sadness, tearfulness, or feelings of hopelessness.  Denies any changes in concentration, making decisions or remembering things.  Denies suicidal or homicidal thoughts.  Sleeps  good.   Increasing the Adderall at LOV has helped her focus and finish tasks.  She doesn't always take the afternoon dose.   Denies dizziness, syncope, seizures, numbness, tingling, tremor, tics, unsteady gait, slurred speech, confusion.   Individual Medical History/ Review of Systems: Changes? :No    Past medications for mental health diagnoses include: Depakote, Celexa, Wellbutrin, Klonopin, Prozac, Zoloft, Lexapro, Adderall   Allergies: Patient has no known allergies.  Current Medications:  Current Outpatient Medications:  .  albuterol (PROVENTIL HFA;VENTOLIN HFA) 108 (90 Base) MCG/ACT inhaler, Inhale 2 puffs into the lungs every 4 (four) hours as needed for wheezing or shortness of breath., Disp: 18 g, Rfl: 1 .  amphetamine-dextroamphetamine (ADDERALL) 20 MG tablet, Take 1 tablet (20 mg total) by mouth 2 (two) times daily., Disp: 60 tablet, Rfl: 0 .  budesonide-formoterol (SYMBICORT) 80-4.5 MCG/ACT inhaler, Inhale 2 puffs into the lungs 2 (two) times daily. USE SPACER. (Patient taking differently: Inhale 2 puffs into the lungs 2 (two) times daily as needed (asthma). USE SPACER.), Disp: 1 Inhaler, Rfl: 5 .  buPROPion (WELLBUTRIN XL) 300 MG 24 hr tablet, Take 1 tablet (300 mg total) by mouth daily., Disp: 90 tablet, Rfl: 1 .  citalopram (CELEXA) 40 MG tablet, Take 1 tablet (40 mg total) by mouth daily., Disp: 90 tablet, Rfl: 1 .  fluticasone (FLONASE) 50 MCG/ACT nasal spray, Two sprays each nostril once a day as needed for nasal congestion or drainage., Disp: 16 g, Rfl: 5 .  lamoTRIgine (LAMICTAL) 100 MG tablet, Take 1 tablet (100 mg total) by mouth daily. (Patient taking differently: Take  150 mg by mouth at bedtime. ), Disp: 90 tablet, Rfl: 1 .  Oxcarbazepine (TRILEPTAL) 300 MG tablet, Take 1 tablet (300 mg total) by mouth 2 (two) times daily. (Patient taking differently: Take 600 mg by mouth at bedtime. ), Disp: 180 tablet, Rfl: 1 .  valACYclovir (VALTREX) 1000 MG tablet, Take 1,000 mg  by mouth daily as needed. , Disp: , Rfl:  .  [START ON 05/18/2019] amphetamine-dextroamphetamine (ADDERALL) 20 MG tablet, Take 1 tablet (20 mg total) by mouth 2 (two) times daily., Disp: 60 tablet, Rfl: 0 .  gabapentin (NEURONTIN) 300 MG capsule, Take 1 capsule (300 mg total) by mouth 2 (two) times daily., Disp: 60 capsule, Rfl: 0 .  levothyroxine (SYNTHROID, LEVOTHROID) 88 MCG tablet, Take 88 mcg by mouth daily before breakfast. , Disp: , Rfl: 2 Medication Side Effects: none  Family Medical/ Social History: Changes? 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 Had to go back this week.   MENTAL HEALTH EXAM:  Last menstrual period 12/14/2018.There is no height or weight on file to calculate BMI.  General Appearance: unable to assess  Eye Contact:  unable to asses  Speech:  Clear and Coherent  Volume:  Normal  Mood:  Euthymic  Affect:  unable to assess  Thought Process:  Goal Directed  Orientation:  Full (Time, Place, and Person)  Thought Content: Logical   Suicidal Thoughts:  No  Homicidal Thoughts:  No  Memory:  WNL  Judgement:  Good  Insight:  Good  Psychomotor Activity:  unable to assess  Concentration:  Concentration: Good and Attention Span: Good  Recall:  Good  Fund of Knowledge: Good  Language: Good  Assets:  Desire for Improvement  ADL's:  Intact  Cognition: WNL  Prognosis:  Good    DIAGNOSES:    ICD-10-CM   1. Bipolar I disorder (HCC) F31.9   2. PTSD (post-traumatic stress disorder) F43.10   3. Attention deficit hyperactivity disorder (ADHD), predominantly inattentive type F90.0     Receiving Psychotherapy: No    RECOMMENDATIONS:  Cont Adderall 20 mg 1 bid prn Cont Wellbutrin XL 300 mg qd Cont Celexa 40 mg qd Increase Gabapentin 300 mg bid (mostly for body pain) Cont Lamictal 150 mg qhs Cont Trileptal 300 mg 2 qhs Return in 2 months.  Melony Overlyeresa Sujata Maines, PA-C   This record has been created using [employer] software.  Chart creation errors  have been sought, but may not always have been located and corrected. Such creation errors do not reflect on the standard of medical care.

## 2019-05-08 ENCOUNTER — Other Ambulatory Visit: Payer: Self-pay | Admitting: Family Medicine

## 2019-05-08 DIAGNOSIS — Z1231 Encounter for screening mammogram for malignant neoplasm of breast: Secondary | ICD-10-CM

## 2019-05-17 ENCOUNTER — Other Ambulatory Visit: Payer: Self-pay

## 2019-05-17 ENCOUNTER — Ambulatory Visit
Admission: RE | Admit: 2019-05-17 | Discharge: 2019-05-17 | Disposition: A | Discharge status: Home / Self Care | Payer: Managed Care, Other (non HMO) | Source: Ambulatory Visit | Attending: Family Medicine | Admitting: Family Medicine

## 2019-05-17 DIAGNOSIS — Z1231 Encounter for screening mammogram for malignant neoplasm of breast: Secondary | ICD-10-CM

## 2019-05-27 ENCOUNTER — Other Ambulatory Visit: Payer: Self-pay | Admitting: Physician Assistant

## 2019-06-05 ENCOUNTER — Other Ambulatory Visit: Payer: Self-pay | Admitting: Physician Assistant

## 2019-06-06 ENCOUNTER — Telehealth: Payer: Self-pay | Admitting: Physician Assistant

## 2019-06-06 NOTE — Telephone Encounter (Signed)
Patient stated she's not feeling right more depressed and anxious

## 2019-06-07 NOTE — Telephone Encounter (Signed)
Please call her and let her know we'll increase Lamictal to 100 mg bid.  (On 150 mg qhs now.)  If she needs more, please send it in, or let me know the pharm and I will. Thanks.

## 2019-06-19 ENCOUNTER — Other Ambulatory Visit: Payer: Self-pay | Admitting: Physician Assistant

## 2019-06-19 ENCOUNTER — Telehealth: Payer: Self-pay | Admitting: Physician Assistant

## 2019-06-19 ENCOUNTER — Ambulatory Visit: Payer: 59 | Admitting: Physician Assistant

## 2019-06-19 MED ORDER — BUPROPION HCL ER (XL) 150 MG PO TB24: 150.0000 mg | ORAL_TABLET | Freq: Every day | ORAL | 0 refills | Status: DC

## 2019-06-19 NOTE — Telephone Encounter (Signed)
Pt aware and agrees with increase

## 2019-06-19 NOTE — Telephone Encounter (Signed)
Patient stated she is having problems with her medication it may need to be tweeked, patient still depressed and anxious

## 2019-06-19 NOTE — Telephone Encounter (Signed)
Increase the Wellbutrin XL to 450mg  total.  I sent in a Rx for 150 mg to add to 300mg  she already has.

## 2019-07-08 ENCOUNTER — Other Ambulatory Visit: Payer: Self-pay | Admitting: Physician Assistant

## 2019-07-19 ENCOUNTER — Ambulatory Visit (INDEPENDENT_AMBULATORY_CARE_PROVIDER_SITE_OTHER): Payer: 59 | Admitting: Physician Assistant

## 2019-07-19 ENCOUNTER — Other Ambulatory Visit: Payer: Self-pay

## 2019-07-19 ENCOUNTER — Encounter: Payer: Self-pay | Admitting: Physician Assistant

## 2019-07-19 DIAGNOSIS — F4310 Post-traumatic stress disorder, unspecified: Secondary | ICD-10-CM

## 2019-07-19 DIAGNOSIS — F319 Bipolar disorder, unspecified: Secondary | ICD-10-CM | POA: Diagnosis not present

## 2019-07-19 DIAGNOSIS — F900 Attention-deficit hyperactivity disorder, predominantly inattentive type: Secondary | ICD-10-CM

## 2019-07-19 DIAGNOSIS — Z79899 Other long term (current) drug therapy: Secondary | ICD-10-CM | POA: Diagnosis not present

## 2019-07-19 MED ORDER — ALPRAZOLAM 0.5 MG PO TABS: 0.2500 mg | ORAL_TABLET | Freq: Two times a day (BID) | ORAL | 0 refills | Status: DC | PRN

## 2019-07-19 MED ORDER — DIVALPROEX SODIUM ER 500 MG PO TB24: ORAL_TABLET | ORAL | 1 refills | Status: DC

## 2019-07-19 NOTE — Progress Notes (Signed)
[facility]  Patient ID: Kelli Pennington,  MRN: [id number]  PCP: Lujean Amel, MD  Date of Evaluation: 07/19/2019 Time spent:30 minutes  Chief Complaint:  Chief Complaint    Anxiety; Depression; Follow-up     Virtual Visit via Telephone Note  I connected with patient by a video enabled telemedicine application or telephone, with their informed consent, and verified patient privacy and that I am speaking with the correct person using two identifiers.  I am private, in my home and the patient is in her car.   I discussed the limitations, risks, security and privacy concerns of performing an evaluation and management service by telephone and the availability of in person appointments. I also discussed with the patient that there may be a patient responsible charge related to this service. The patient expressed understanding and agreed to proceed.   I discussed the assessment and treatment plan with the patient. The patient was provided an opportunity to ask questions and all were answered. The patient agreed with the plan and demonstrated an understanding of the instructions.   The patient was advised to call back or seek an in-person evaluation if the symptoms worsen or if the condition fails to improve as anticipated.  I provided 30 minutes of non-face-to-face time during this encounter.  HISTORY/CURRENT STATUS: HPI For routine med check.  "I'm a mess. I don't have a lot of energy.  I get really anxious and wound up, and I can't quit thinking about things when something bothers me."  States when she gets triggered, like at work, and she can't let it go, she gets so anxious and mad, and the rest of her day is ruined.  Her muscles get tight and she can't relax. "I can't let things go after I've already dealt with it.  I just keep letting it pile up in my mind." Has a lot of mind chatter. Cries easily.  This is been going on for several months.  States that when she took  Depakote in the past she remembers doing better than she is doing now.  That was changed because she was having trouble swallowing the pills.  She states right now she would rather try again if that would be helpful and go off the Trileptal if that is what is needed.  Patient denies increased energy with decreased need for sleep, no increased talkativeness, no racing thoughts, no impulsivity or risky behaviors, no increased spending, no increased libido, no grandiosity.  She does get irritable easily.  States that most of the time there is valid reasons to be irritated though.  Stressors at work mostly.  She is able to focus and stay on task.  She is able to complete things in a timely manner without getting easily distracted.  Denies dizziness, syncope, seizures, numbness, tingling, tremor, tics, unsteady gait, slurred speech, confusion. Denies muscle or joint pain, stiffness, or dystonia.  Individual Medical History/ Review of Systems: Changes? :No    Past medications for mental health diagnoses include: Depakote, Celexa, Wellbutrin, Klonopin, Prozac, Zoloft, Lexapro, Adderall  Allergies: Patient has no known allergies.  Current Medications:  Current Outpatient Medications:  .  albuterol (PROVENTIL HFA;VENTOLIN HFA) 108 (90 Base) MCG/ACT inhaler, Inhale 2 puffs into the lungs every 4 (four) hours as needed for wheezing or shortness of breath., Disp: 18 g, Rfl: 1 .  amphetamine-dextroamphetamine (ADDERALL) 20 MG tablet, Take 1 tablet (20 mg total) by mouth 2 (two) times daily., Disp: 60 tablet, Rfl: 0 .  amphetamine-dextroamphetamine (ADDERALL)  20 MG tablet, Take 1 tablet (20 mg total) by mouth 2 (two) times daily., Disp: 60 tablet, Rfl: 0 .  budesonide-formoterol (SYMBICORT) 80-4.5 MCG/ACT inhaler, Inhale 2 puffs into the lungs 2 (two) times daily. USE SPACER. (Patient taking differently: Inhale 2 puffs into the lungs 2 (two) times daily as needed (asthma). USE SPACER.), Disp: 1 Inhaler, Rfl:  5 .  buPROPion (WELLBUTRIN XL) 150 MG 24 hr tablet, Take 1 tablet (150 mg total) by mouth daily. Take 1 w/ the Wellbutrin XL 300 mg= 450 mg/day., Disp: 90 tablet, Rfl: 0 .  buPROPion (WELLBUTRIN XL) 300 MG 24 hr tablet, TAKE 1 TABLET BY MOUTH EVERY DAY, Disp: 90 tablet, Rfl: 1 .  citalopram (CELEXA) 40 MG tablet, Take 1 tablet (40 mg total) by mouth daily., Disp: 90 tablet, Rfl: 1 .  fluticasone (FLONASE) 50 MCG/ACT nasal spray, Two sprays each nostril once a day as needed for nasal congestion or drainage., Disp: 16 g, Rfl: 5 .  gabapentin (NEURONTIN) 300 MG capsule, Take 1 capsule (300 mg total) by mouth 2 (two) times daily. (Patient taking differently: Take 300 mg by mouth at bedtime. ), Disp: 60 capsule, Rfl: 0 .  lamoTRIgine (LAMICTAL) 100 MG tablet, TAKE 1 TABLET BY MOUTH EVERY DAY (Patient taking differently: 150 mg daily. ), Disp: 90 tablet, Rfl: 0 .  levothyroxine (SYNTHROID, LEVOTHROID) 88 MCG tablet, Take 88 mcg by mouth daily before breakfast. , Disp: , Rfl: 2 .  valACYclovir (VALTREX) 1000 MG tablet, Take 1,000 mg by mouth daily as needed. , Disp: , Rfl:  .  ALPRAZolam (XANAX) 0.5 MG tablet, Take 0.5-1 tablets (0.25-0.5 mg total) by mouth 2 (two) times daily as needed for anxiety., Disp: 30 tablet, Rfl: 0 .  divalproex (DEPAKOTE ER) 500 MG 24 hr tablet, 1 qhs for 3 days, then 2 qhs., Disp: 60 tablet, Rfl: 1 Medication Side Effects: none  Family Medical/ Social History: Changes? No  MENTAL HEALTH EXAM:  Last menstrual period 12/14/2018.There is no height or weight on file to calculate BMI.  General Appearance: unable to assess  Eye Contact:  unable to assess  Speech:  Clear and Coherent  Volume:  Normal  Mood:  Depressed  Affect:  unable to assess she is tearful over the phone but consolable.  Thought Process:  Goal Directed  Orientation:  Full (Time, Place, and Person)  Thought Content: Logical   Suicidal Thoughts:  No  Homicidal Thoughts:  No  Memory:  WNL  Judgement:  Good   Insight:  Good  Psychomotor Activity:  unable to assess  Concentration:  Concentration: Good and Attention Span: Good  Recall:  Good  Fund of Knowledge: Good  Language: Good  Assets:  Desire for Improvement  ADL's:  Intact  Cognition: WNL  Prognosis:  Good    DIAGNOSES:    ICD-10-CM   1. Bipolar I disorder (HCC)  F31.9   2. PTSD (post-traumatic stress disorder)  F43.10   3. Attention deficit hyperactivity disorder (ADHD), predominantly inattentive type  F90.0   4. Encounter for long-term (current) use of medications  Z79.899 CBC with Differential/Platelet    Comprehensive metabolic panel    Valproic acid level    Receiving Psychotherapy: No    RECOMMENDATIONS:  I spent 30 minutes with her and at least 50% of that time was in counseling concerning different treatment options.  We both agree that the Depakote worked better so we will stop the Trileptal and go with Depakote. Decrease Trileptal to 300 mg 1  qhs for 3 nights. Then stop.  Start VPA ER 500mg  1 qhs for 3 nights, then 2 qhs. Decrease Lamictal to 100mg , 3/4 pill (she states she can cut that way.) She wrote these directions down and verbalizes understanding. Start Xanax 0.5 mg, 1/2-1 twice daily as needed.  She knows to take this only occasionally for emergencies. Continue Adderall 20 mg twice daily as needed. Continue Wellbutrin XL total 450 mg daily. Continue Celexa 40 mg daily. Get VPA level, LFTs, CBC in approximately 10 days. Recommend getting back in counseling. Return in 4 to 6 weeks.  Kelli Overlyeresa Hurst, PA-C   This record has been created using [employer] software.  Chart creation errors have been sought, but may not always have been located and corrected. Such creation errors do not reflect on the standard of medical care.

## 2019-08-06 ENCOUNTER — Telehealth: Payer: Self-pay | Admitting: Physician Assistant

## 2019-08-06 ENCOUNTER — Other Ambulatory Visit: Payer: Self-pay

## 2019-08-06 MED ORDER — AMPHETAMINE-DEXTROAMPHETAMINE 20 MG PO TABS: 20.0000 mg | ORAL_TABLET | Freq: Two times a day (BID) | ORAL | 0 refills | Status: DC

## 2019-08-06 NOTE — Telephone Encounter (Signed)
Pended for approval, last appt 07/19/2019 Last refill 06/28/2019

## 2019-08-06 NOTE — Telephone Encounter (Signed)
Patient need refill on Adderall to be sent to [facility] in [place]

## 2019-09-02 ENCOUNTER — Other Ambulatory Visit: Payer: Self-pay | Admitting: Physician Assistant

## 2019-09-24 ENCOUNTER — Other Ambulatory Visit: Payer: Self-pay | Admitting: Physician Assistant

## 2019-10-08 ENCOUNTER — Other Ambulatory Visit: Payer: Self-pay | Admitting: Physician Assistant

## 2019-12-11 ENCOUNTER — Other Ambulatory Visit: Payer: Self-pay

## 2019-12-11 ENCOUNTER — Telehealth: Payer: Self-pay | Admitting: Physician Assistant

## 2019-12-11 MED ORDER — AMPHETAMINE-DEXTROAMPHETAMINE 20 MG PO TABS: 20.0000 mg | ORAL_TABLET | Freq: Two times a day (BID) | ORAL | 0 refills | Status: DC

## 2019-12-11 NOTE — Telephone Encounter (Signed)
Last refill 10/24/2019 Pended for approval

## 2019-12-11 NOTE — Telephone Encounter (Signed)
Pt called to request refill for Adderall @ [facility] [place]. Next appt 1/5

## 2019-12-24 ENCOUNTER — Ambulatory Visit: Payer: 59 | Admitting: Physician Assistant

## 2020-01-04 ENCOUNTER — Other Ambulatory Visit: Payer: Self-pay | Admitting: Physician Assistant

## 2020-01-28 ENCOUNTER — Encounter: Payer: Self-pay | Admitting: Physician Assistant

## 2020-01-28 ENCOUNTER — Ambulatory Visit (INDEPENDENT_AMBULATORY_CARE_PROVIDER_SITE_OTHER): Payer: 59 | Admitting: Physician Assistant

## 2020-01-28 DIAGNOSIS — F900 Attention-deficit hyperactivity disorder, predominantly inattentive type: Secondary | ICD-10-CM | POA: Diagnosis not present

## 2020-01-28 DIAGNOSIS — F319 Bipolar disorder, unspecified: Secondary | ICD-10-CM

## 2020-01-28 DIAGNOSIS — F4310 Post-traumatic stress disorder, unspecified: Secondary | ICD-10-CM

## 2020-01-28 MED ORDER — DIVALPROEX SODIUM ER 500 MG PO TB24: ORAL_TABLET | ORAL | 1 refills | Status: DC

## 2020-01-28 MED ORDER — AMPHETAMINE-DEXTROAMPHETAMINE 20 MG PO TABS: 20.0000 mg | ORAL_TABLET | Freq: Two times a day (BID) | ORAL | 0 refills | Status: DC

## 2020-01-28 MED ORDER — GABAPENTIN 300 MG PO CAPS: 600.0000 mg | ORAL_CAPSULE | Freq: Every day | ORAL | 0 refills | Status: DC

## 2020-01-28 NOTE — Progress Notes (Signed)
[facility]  Patient ID: Kelli Pennington,  MRN: [id number]  PCP: Lujean Amel, MD  Date of Evaluation: 01/28/2020 Time spent:20 minutes  Chief Complaint:  Chief Complaint    Follow-up; Other      HISTORY/CURRENT STATUS: HPI for routine med check.  Patient states she is doing well and feels like her medications are working just fine.  There has been confusion about the Wellbutrin XL and she is only on 300 mg not 450 which is stated in the chart.  She feels that it is working fine at this dose.  She is able to enjoy things.  Energy and motivation are good.  She does not cry easily.  Work is going well and she is not missing any days due to depressive symptoms.  She denies suicidal or homicidal thoughts.  She has been out of the Adderall for about a week and she can tell a huge difference in her memory and focus.  She is easily distracted and having a hard time finishing tasks.  Patient denies increased energy with decreased need for sleep, no increased talkativeness, no racing thoughts, no impulsivity or risky behaviors, no increased spending, no increased libido, no grandiosity, no irritability, no hallucinations.  She is having problems with increased generalized muscle pain which she thinks is fibromyalgia.  She is taking the gabapentin but had a very low dose and cannot tolerate it twice a day.  She has been on that to help with anxiety and restlessness in the evenings.  It does help that.  It makes her too sleepy to tolerate it twice a day.  Denies dizziness, syncope, seizures, numbness, tingling, tremor, tics, unsteady gait, slurred speech, confusion. Denies muscle or joint pain, stiffness, or dystonia.  Individual Medical History/ Review of Systems: Changes? :No    Past medications for mental health diagnoses include: Depakote, Celexa, Wellbutrin, Klonopin, Prozac, Zoloft, Lexapro, Adderall  Allergies: Fish-derived products and Peanut-containing drug  products  Current Medications:  Current Outpatient Medications:  .  albuterol (PROVENTIL HFA;VENTOLIN HFA) 108 (90 Base) MCG/ACT inhaler, Inhale 2 puffs into the lungs every 4 (four) hours as needed for wheezing or shortness of breath., Disp: 18 g, Rfl: 1 .  ALPRAZolam (XANAX) 0.5 MG tablet, Take 0.5-1 tablets (0.25-0.5 mg total) by mouth 2 (two) times daily as needed for anxiety., Disp: 30 tablet, Rfl: 0 .  [START ON 03/25/2020] amphetamine-dextroamphetamine (ADDERALL) 20 MG tablet, Take 1 tablet (20 mg total) by mouth 2 (two) times daily., Disp: 60 tablet, Rfl: 0 .  [START ON 02/24/2020] amphetamine-dextroamphetamine (ADDERALL) 20 MG tablet, Take 1 tablet (20 mg total) by mouth 2 (two) times daily., Disp: 60 tablet, Rfl: 0 .  amphetamine-dextroamphetamine (ADDERALL) 20 MG tablet, Take 1 tablet (20 mg total) by mouth 2 (two) times daily., Disp: 60 tablet, Rfl: 0 .  budesonide-formoterol (SYMBICORT) 80-4.5 MCG/ACT inhaler, Inhale 2 puffs into the lungs 2 (two) times daily. USE SPACER. (Patient taking differently: Inhale 2 puffs into the lungs 2 (two) times daily as needed (asthma). USE SPACER.), Disp: 1 Inhaler, Rfl: 5 .  buPROPion (WELLBUTRIN XL) 300 MG 24 hr tablet, TAKE 1 TABLET BY MOUTH EVERY DAY, Disp: 90 tablet, Rfl: 0 .  citalopram (CELEXA) 40 MG tablet, Take 1 tablet (40 mg total) by mouth daily., Disp: 90 tablet, Rfl: 1 .  divalproex (DEPAKOTE ER) 500 MG 24 hr tablet, 2 qhs., Disp: 60 tablet, Rfl: 1 .  fluticasone (FLONASE) 50 MCG/ACT nasal spray, Two sprays each nostril once a day as needed  for nasal congestion or drainage., Disp: 16 g, Rfl: 5 .  gabapentin (NEURONTIN) 300 MG capsule, Take 2 capsules (600 mg total) by mouth at bedtime., Disp: 60 capsule, Rfl: 0 .  lamoTRIgine (LAMICTAL) 100 MG tablet, TAKE 1 TABLET BY MOUTH EVERY DAY, Disp: 90 tablet, Rfl: 0 .  levothyroxine (SYNTHROID, LEVOTHROID) 88 MCG tablet, Take 88 mcg by mouth daily before breakfast. , Disp: , Rfl: 2 .  valACYclovir  (VALTREX) 1000 MG tablet, Take 1,000 mg by mouth daily as needed. , Disp: , Rfl:  Medication Side Effects: none  Family Medical/ Social History: Changes? No  MENTAL HEALTH EXAM:  Last menstrual period 12/14/2018.There is no height or weight on file to calculate BMI.  General Appearance: unable to assess  Eye Contact:  unable to assess  Speech:  Clear and Coherent  Volume:  Normal  Mood:  Euthymic  Affect:  Appropriate  Thought Process:  Goal Directed  Orientation:  Full (Time, Place, and Person)  Thought Content: Logical   Suicidal Thoughts:  No  Homicidal Thoughts:  No  Memory:  WNL  Judgement:  Good  Insight:  Good  Psychomotor Activity:  Unable to assess  Concentration:  Concentration: Good and Attention Span: Good  Recall:  Good  Fund of Knowledge: Good  Language: Good  Assets:  Desire for Improvement  ADL's:  Intact  Cognition: WNL  Prognosis:  Good    DIAGNOSES:    ICD-10-CM   1. Bipolar I disorder (HCC)  F31.9   2. PTSD (post-traumatic stress disorder)  F43.10   3. Attention deficit hyperactivity disorder (ADHD), predominantly inattentive type  F90.0     Receiving Psychotherapy: No    RECOMMENDATIONS:  I spent 20 minutes with her. We discussed the possible fibromyalgia.  Cymbalta is often prescribed for that and we could try it but we would have to get her off the Celexa.  We agreed to not change anything with the antidepressants right now but we agreed to increase the gabapentin.  If she could tolerate it twice a day that would be great but if not just take it in the evenings, that should help her sleep and sometimes that is enough to improve the generalized muscle pain. Continue Wellbutrin XL 300 mg qd.  there has been confusion about the dose but she has been taking this all along and it is working fine so no changes will be made.  Having labs done in next week or so.  Continue Xanax 0.5 mg 1/2-1 twice daily as needed.  Take sparingly. Continue Adderall 20 mg  1 twice daily. Continue Celexa 40 mg p.o. daily. Continue Depakote ER 500 mg, 2 p.o. nightly. Increase gabapentin 300 mg to 2 p.o. nightly. Continue Lamictal 100 mg daily. She will see her PCP soon and will have labs done at that time.  Having the Depakote level, CMP and CBC are very important. Return in 6 months.  Melony Overly, PA-C

## 2020-02-20 DIAGNOSIS — Z0289 Encounter for other administrative examinations: Secondary | ICD-10-CM

## 2020-03-03 ENCOUNTER — Other Ambulatory Visit: Payer: Self-pay | Admitting: Physician Assistant

## 2020-03-18 ENCOUNTER — Other Ambulatory Visit: Payer: Self-pay | Admitting: Physician Assistant

## 2020-03-18 DIAGNOSIS — Z79899 Other long term (current) drug therapy: Secondary | ICD-10-CM

## 2020-03-18 NOTE — Progress Notes (Signed)
See lab note.  

## 2020-03-18 NOTE — Progress Notes (Signed)
Please see if she's taking VPA as directed.  Supposed to be on 1,000mg . Level is only 22 and need it at least 50. If taking as directed, increase to 1,500 mg.  If not, then take 1,000. Either way, will need to recheck level in 7-10 days. I'll order the lab and have the ladies up front mail her the order.  Thanks.

## 2020-05-25 ENCOUNTER — Telehealth: Payer: Self-pay | Admitting: Physician Assistant

## 2020-05-25 ENCOUNTER — Other Ambulatory Visit: Payer: Self-pay

## 2020-05-25 MED ORDER — AMPHETAMINE-DEXTROAMPHETAMINE 20 MG PO TABS: 20.0000 mg | ORAL_TABLET | Freq: Two times a day (BID) | ORAL | 0 refills | Status: DC

## 2020-05-25 NOTE — Telephone Encounter (Signed)
Pended 3 Adderall Rx's to be submitted by Rosey Bath Patient due back in August 2021  Will refill other medications and submit for refills if needed.

## 2020-05-25 NOTE — Telephone Encounter (Signed)
Pt requesting a refill on all due meds. Fill at the [facility] [address] [place].

## 2020-05-26 ENCOUNTER — Other Ambulatory Visit: Payer: Self-pay

## 2020-05-26 MED ORDER — DIVALPROEX SODIUM ER 500 MG PO TB24: ORAL_TABLET | ORAL | 0 refills | Status: DC

## 2020-05-26 MED ORDER — GABAPENTIN 300 MG PO CAPS: 600.0000 mg | ORAL_CAPSULE | Freq: Every day | ORAL | 0 refills | Status: DC

## 2020-05-26 MED ORDER — CITALOPRAM HYDROBROMIDE 40 MG PO TABS: 40.0000 mg | ORAL_TABLET | Freq: Every day | ORAL | 0 refills | Status: DC

## 2020-06-02 ENCOUNTER — Other Ambulatory Visit: Payer: Self-pay

## 2020-06-02 ENCOUNTER — Telehealth: Payer: Self-pay | Admitting: Physician Assistant

## 2020-06-02 ENCOUNTER — Other Ambulatory Visit: Payer: Self-pay | Admitting: Physician Assistant

## 2020-06-02 DIAGNOSIS — Z79899 Other long term (current) drug therapy: Secondary | ICD-10-CM

## 2020-06-02 MED ORDER — LAMOTRIGINE 100 MG PO TABS: 100.0000 mg | ORAL_TABLET | Freq: Every day | ORAL | 1 refills | Status: DC

## 2020-06-02 NOTE — Telephone Encounter (Signed)
Pt says she needs to get labs for Depakote and CBC. If so, please order them and then print and mail to her.

## 2020-06-02 NOTE — Telephone Encounter (Signed)
Pt is also needing a new Lamictal RX sent to the [facility] [address] In [place] as well.

## 2020-06-02 NOTE — Telephone Encounter (Signed)
Rx sent 

## 2020-06-02 NOTE — Telephone Encounter (Signed)
Labs ordered through [employer] [facility].  Will be mailed to pt.

## 2020-08-17 ENCOUNTER — Other Ambulatory Visit: Payer: Self-pay | Admitting: Physician Assistant

## 2020-09-18 ENCOUNTER — Telehealth: Payer: Self-pay | Admitting: Physician Assistant

## 2020-09-18 NOTE — Telephone Encounter (Signed)
Pt requesting a refill on the Adderall. Fill at the [facility] [address] [place] . Last seen in Feb made follow up 11/3.

## 2020-09-21 ENCOUNTER — Other Ambulatory Visit: Payer: Self-pay

## 2020-09-21 MED ORDER — AMPHETAMINE-DEXTROAMPHETAMINE 20 MG PO TABS: 20.0000 mg | ORAL_TABLET | Freq: Two times a day (BID) | ORAL | 0 refills | Status: DC

## 2020-09-21 NOTE — Telephone Encounter (Signed)
Last refill 08/11/20 Pended for Rosey Bath to send Next apt 10/21/20

## 2020-10-08 ENCOUNTER — Other Ambulatory Visit: Payer: Self-pay | Admitting: Orthopedic Surgery

## 2020-10-08 DIAGNOSIS — M545 Low back pain, unspecified: Secondary | ICD-10-CM

## 2020-10-21 ENCOUNTER — Ambulatory Visit (INDEPENDENT_AMBULATORY_CARE_PROVIDER_SITE_OTHER): Payer: 59 | Admitting: Physician Assistant

## 2020-10-21 ENCOUNTER — Other Ambulatory Visit: Payer: Self-pay

## 2020-10-21 ENCOUNTER — Encounter: Payer: Self-pay | Admitting: Physician Assistant

## 2020-10-21 VITALS — BP 125/85 | HR 94

## 2020-10-21 DIAGNOSIS — F411 Generalized anxiety disorder: Secondary | ICD-10-CM | POA: Diagnosis not present

## 2020-10-21 DIAGNOSIS — Z79899 Other long term (current) drug therapy: Secondary | ICD-10-CM

## 2020-10-21 DIAGNOSIS — F900 Attention-deficit hyperactivity disorder, predominantly inattentive type: Secondary | ICD-10-CM | POA: Diagnosis not present

## 2020-10-21 DIAGNOSIS — F4310 Post-traumatic stress disorder, unspecified: Secondary | ICD-10-CM

## 2020-10-21 DIAGNOSIS — F319 Bipolar disorder, unspecified: Secondary | ICD-10-CM

## 2020-10-21 MED ORDER — ALPRAZOLAM 0.5 MG PO TABS
0.2500 mg | ORAL_TABLET | Freq: Two times a day (BID) | ORAL | 1 refills | Status: DC | PRN
Start: 2020-10-21 — End: 2021-05-14

## 2020-10-21 MED ORDER — DIVALPROEX SODIUM ER 500 MG PO TB24: ORAL_TABLET | ORAL | 1 refills | Status: DC

## 2020-10-21 MED ORDER — AMPHETAMINE-DEXTROAMPHETAMINE 30 MG PO TABS: 30.0000 mg | ORAL_TABLET | Freq: Two times a day (BID) | ORAL | 0 refills | Status: DC

## 2020-10-21 MED ORDER — CITALOPRAM HYDROBROMIDE 40 MG PO TABS: 40.0000 mg | ORAL_TABLET | Freq: Every day | ORAL | 1 refills | Status: DC

## 2020-10-21 MED ORDER — BUPROPION HCL ER (XL) 150 MG PO TB24: 150.0000 mg | ORAL_TABLET | Freq: Every day | ORAL | 1 refills | Status: DC

## 2020-10-21 MED ORDER — LAMOTRIGINE 100 MG PO TABS: 100.0000 mg | ORAL_TABLET | Freq: Every day | ORAL | 1 refills | Status: DC

## 2020-10-21 NOTE — Progress Notes (Signed)
[facility]  Patient ID: Kelli Pennington,  MRN: [id number]  PCP: Darrow Bussing, MD  Date of Evaluation: 10/21/2020 Time spent:30 minutes  Chief Complaint:  Chief Complaint    Anxiety; Depression      HISTORY/CURRENT STATUS: HPI for routine med check.  Hasn't been focusing as well for months now. Seems like the Adderall isn't as effective as in the past. And when she is focused, can't concentrate on anything very long. Feels 'foggy headed' and can't remember things sometimes. This is not new.   Is really tired when she's off work. Doesn't have much energy or motivation. The Wellbutrin might be making her more anxious too. Feels more nervous after taking it. Occurring for several months. Not taking the Xanax often but likes to have it as a 'safety net.'  States this world is crazy right now and that causes anxiety if she thinks about it too much. Not isolating. Hygiene is nl. Not crying easily. No SI/HI.  Patient denies increased energy with decreased need for sleep, no increased talkativeness, no racing thoughts, no impulsivity or risky behaviors, no increased spending, no increased libido, no grandiosity, no irritability, no paranoia, and no hallucinations.  Denies dizziness, syncope, seizures, numbness, tingling, tremor, tics, unsteady gait, slurred speech, confusion. Denies muscle or joint pain, stiffness, or dystonia.  Individual Medical History/ Review of Systems: Changes? :No    Past medications for mental health diagnoses include: Depakote, Celexa, Wellbutrin, Klonopin, Prozac, Zoloft, Lexapro, Adderall  Allergies: Fish-derived products and Peanut-containing drug products  Current Medications:  Current Outpatient Medications:  .  citalopram (CELEXA) 40 MG tablet, Take 1 tablet (40 mg total) by mouth daily., Disp: 90 tablet, Rfl: 1 .  divalproex (DEPAKOTE ER) 500 MG 24 hr tablet, TAKE 2 TABLETS AT BEDTIME, Disp: 180 tablet, Rfl: 1 .  gabapentin (NEURONTIN) 300 MG  capsule, Take 2 capsules (600 mg total) by mouth at bedtime. (Patient taking differently: Take 600 mg by mouth at bedtime. 1 q am, 2 qhs), Disp: 180 capsule, Rfl: 0 .  lamoTRIgine (LAMICTAL) 100 MG tablet, Take 1 tablet (100 mg total) by mouth daily., Disp: 90 tablet, Rfl: 1 .  levothyroxine (SYNTHROID, LEVOTHROID) 88 MCG tablet, Take 88 mcg by mouth daily before breakfast. , Disp: , Rfl: 2 .  valACYclovir (VALTREX) 1000 MG tablet, Take 1,000 mg by mouth daily as needed. , Disp: , Rfl:  .  albuterol (PROVENTIL HFA;VENTOLIN HFA) 108 (90 Base) MCG/ACT inhaler, Inhale 2 puffs into the lungs every 4 (four) hours as needed for wheezing or shortness of breath. (Patient not taking: Reported on 10/21/2020), Disp: 18 g, Rfl: 1 .  ALPRAZolam (XANAX) 0.5 MG tablet, Take 0.5-1 tablets (0.25-0.5 mg total) by mouth 2 (two) times daily as needed for anxiety., Disp: 30 tablet, Rfl: 1 .  amphetamine-dextroamphetamine (ADDERALL) 30 MG tablet, Take 1 tablet by mouth 2 (two) times daily., Disp: 60 tablet, Rfl: 0 .  budesonide-formoterol (SYMBICORT) 80-4.5 MCG/ACT inhaler, Inhale 2 puffs into the lungs 2 (two) times daily. USE SPACER. (Patient not taking: Reported on 10/21/2020), Disp: 1 Inhaler, Rfl: 5 .  buPROPion (WELLBUTRIN XL) 150 MG 24 hr tablet, Take 1 tablet (150 mg total) by mouth daily., Disp: 30 tablet, Rfl: 1 .  fluticasone (FLONASE) 50 MCG/ACT nasal spray, Two sprays each nostril once a day as needed for nasal congestion or drainage. (Patient not taking: Reported on 10/21/2020), Disp: 16 g, Rfl: 5 Medication Side Effects: none  Family Medical/ Social History: Changes? 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 Job is much less stressful as the previous position she was in.   MENTAL HEALTH EXAM:  Blood pressure 125/85, pulse 94, last menstrual period 12/14/2018.There is no height or weight on file to calculate BMI.   General Appearance: Casual, Neat and Well Groomed  Eye Contact:  Good  Speech:  Clear and Coherent and Normal Rate  Volume:  Normal  Mood:  Euthymic  Affect:  Appropriate  Thought Process:  Goal Directed  Orientation:  Full (Time, Place, and Person)  Thought Content: Logical   Suicidal Thoughts:  No  Homicidal Thoughts:  No  Memory:  WNL  Judgement:  Good  Insight:  Good  Psychomotor Activity:  Normal  Concentration:  Concentration: Good and Attention Span: Good  Recall:  Good  Fund of Knowledge: Good  Language: Good  Assets:  Desire for Improvement  ADL's:  Intact  Cognition: WNL  Prognosis:  Good    DIAGNOSES:    ICD-10-CM   1. Attention deficit hyperactivity disorder (ADHD), predominantly inattentive type  F90.0   2. Encounter for long-term (current) use of medications  Z79.899 CBC with Differential/Platelet    Comprehensive metabolic panel    Valproic acid level    Ammonia    Lamotrigine level  3. PTSD (post-traumatic stress disorder)  F43.10   4. Generalized anxiety disorder  F41.1   5. Bipolar I disorder (HCC)  F31.9     Receiving Psychotherapy: No    RECOMMENDATIONS:  PDMP reviewed.  I provided 30 minutes of face to face time during this encounter.  Discussed the memory problems, inattention, and foggy feeling when trying to stay on task. The now uncontrolled ADD can cause all those sx. Recommend increasing Adderall. Also Depakote can increase ammonia level, which can cause it as well. She's had an ammonia level in the past 4-5 years (per old paper chart) with high normal value. Recommend having that drawn again while getting routine labs.  For anxiety, agree to decrease Wellbutrin.  Decrease Wellbutrin XL150 mg 1 q am. Increase Adderall to 30 mg 1 twice daily. Continue Celexa 40 mg p.o. daily. Continue Depakote ER 500 mg, 2 p.o. nightly. Continue gabapentin 300 mg, 1 po q am. and 2 p.o. nightly. Continue Lamictal 100 mg daily. Labs ordered as noted  above. Return in 4 weeks.   Melony Overly, PA-C

## 2020-10-23 ENCOUNTER — Other Ambulatory Visit: Payer: Self-pay | Admitting: Orthopedic Surgery

## 2020-10-23 ENCOUNTER — Other Ambulatory Visit: Payer: Self-pay | Admitting: Family Medicine

## 2020-10-23 DIAGNOSIS — Q7649 Other congenital malformations of spine, not associated with scoliosis: Secondary | ICD-10-CM

## 2020-10-30 ENCOUNTER — Other Ambulatory Visit: Payer: Managed Care, Other (non HMO)

## 2020-11-02 ENCOUNTER — Other Ambulatory Visit: Payer: Self-pay

## 2020-11-02 ENCOUNTER — Ambulatory Visit
Admission: RE | Admit: 2020-11-02 | Discharge: 2020-11-02 | Disposition: A | Discharge status: Home / Self Care | Payer: Managed Care, Other (non HMO) | Source: Ambulatory Visit | Attending: Orthopedic Surgery | Admitting: Orthopedic Surgery

## 2020-11-02 ENCOUNTER — Other Ambulatory Visit: Payer: Managed Care, Other (non HMO)

## 2020-11-02 ENCOUNTER — Other Ambulatory Visit: Payer: Self-pay | Admitting: Orthopedic Surgery

## 2020-11-02 DIAGNOSIS — Q7649 Other congenital malformations of spine, not associated with scoliosis: Secondary | ICD-10-CM

## 2020-11-02 MED ORDER — KETOROLAC TROMETHAMINE 30 MG/ML IJ SOLN
30.0000 mg | Freq: Once | INTRAMUSCULAR | Status: AC
  Administered 2020-11-02: 30 mg via INTRAMUSCULAR

## 2020-11-02 NOTE — Discharge Instr - Other Orders (Addendum)
Pt brought over to nurses station after CT guided steroid injection due to "cramping like pain in left leg and buttocks". Dr. Alfredo Batty at pts bedside, ordered to give Toradol, see MAR.    1548: pt able to walk without pain. Dr. Alfredo Batty saw pt and discharged her home. Pts spouse driving her home. Assisted out to car.

## 2020-11-11 ENCOUNTER — Other Ambulatory Visit: Payer: Managed Care, Other (non HMO)

## 2020-11-13 ENCOUNTER — Other Ambulatory Visit: Payer: Self-pay | Admitting: Physician Assistant

## 2020-11-19 LAB — COMPREHENSIVE METABOLIC PANEL
ALT: 6 IU/L (ref 0–32)
AST: 9 IU/L (ref 0–40)
Albumin/Globulin Ratio: 2.3 — ABNORMAL HIGH (ref 1.2–2.2)
Albumin: 4.9 g/dL (ref 3.8–4.9)
Alkaline Phosphatase: 45 IU/L (ref 44–121)
BUN/Creatinine Ratio: 14 (ref 9–23)
BUN: 10 mg/dL (ref 6–24)
Bilirubin Total: 0.2 mg/dL (ref 0.0–1.2)
CO2: 24 mmol/L (ref 20–29)
Calcium: 9.3 mg/dL (ref 8.7–10.2)
Chloride: 103 mmol/L (ref 96–106)
Creatinine, Ser: 0.74 mg/dL (ref 0.57–1.00)
GFR calc Af Amer: 108 mL/min/{1.73_m2} (ref >59)
GFR calc non Af Amer: 94 mL/min/{1.73_m2} (ref >59)
Globulin, Total: 2.1 g/dL (ref 1.5–4.5)
Glucose: 80 mg/dL (ref 65–99)
Potassium: 4.5 mmol/L (ref 3.5–5.2)
Sodium: 144 mmol/L (ref 134–144)
Total Protein: 7 g/dL (ref 6.0–8.5)

## 2020-11-19 LAB — CBC WITH DIFFERENTIAL/PLATELET
Basophils Absolute: 0 10*3/uL (ref 0.0–0.2)
Basos: 1 %
EOS (ABSOLUTE): 0.1 10*3/uL (ref 0.0–0.4)
Eos: 1 %
Hematocrit: 39.5 % (ref 34.0–46.6)
Hemoglobin: 13.4 g/dL (ref 11.1–15.9)
Immature Grans (Abs): 0 10*3/uL (ref 0.0–0.1)
Immature Granulocytes: 0 %
Lymphocytes Absolute: 2.4 10*3/uL (ref 0.7–3.1)
Lymphs: 50 %
MCH: 31.8 pg (ref 26.6–33.0)
MCHC: 33.9 g/dL (ref 31.5–35.7)
MCV: 94 fL (ref 79–97)
Monocytes Absolute: 0.4 10*3/uL (ref 0.1–0.9)
Monocytes: 7 %
Neutrophils Absolute: 2 10*3/uL (ref 1.4–7.0)
Neutrophils: 41 %
Platelets: 214 10*3/uL (ref 150–450)
RBC: 4.21 x10E6/uL (ref 3.77–5.28)
RDW: 12.2 % (ref 11.7–15.4)
WBC: 4.9 10*3/uL (ref 3.4–10.8)

## 2020-11-19 LAB — AMMONIA: Ammonia: 66 ug/dL (ref 34–178)

## 2020-11-19 LAB — LAMOTRIGINE LEVEL: Lamotrigine Lvl: 7.6 ug/mL (ref 2.0–20.0)

## 2020-11-19 LAB — VALPROIC ACID LEVEL: Valproic Acid Lvl: 91 ug/mL (ref 50–100)

## 2020-11-19 NOTE — Progress Notes (Signed)
Please let her know Depakote level is 91 which is good.  Stay on the same dose.  CBC is normal, CMP shows normal kidney and liver functions, glucose was 80, also normal as are the remainder of numbers.

## 2020-11-25 ENCOUNTER — Telehealth: Payer: Self-pay | Admitting: Physician Assistant

## 2020-11-25 NOTE — Telephone Encounter (Signed)
Pt LM on VM requesting Rx for Adderall. Will run out before apt 12/14. Contact # [phone]

## 2020-11-26 ENCOUNTER — Other Ambulatory Visit: Payer: Self-pay

## 2020-11-26 NOTE — Telephone Encounter (Signed)
Last refill 10/21/20 Pended for Rosey Bath to send Adderall

## 2020-11-27 MED ORDER — AMPHETAMINE-DEXTROAMPHETAMINE 30 MG PO TABS: 30.0000 mg | ORAL_TABLET | Freq: Two times a day (BID) | ORAL | 0 refills | Status: DC

## 2020-12-01 ENCOUNTER — Other Ambulatory Visit: Payer: Self-pay

## 2020-12-01 ENCOUNTER — Encounter: Payer: Self-pay | Admitting: Physician Assistant

## 2020-12-01 ENCOUNTER — Ambulatory Visit (INDEPENDENT_AMBULATORY_CARE_PROVIDER_SITE_OTHER): Payer: 59 | Admitting: Physician Assistant

## 2020-12-01 DIAGNOSIS — F319 Bipolar disorder, unspecified: Secondary | ICD-10-CM | POA: Diagnosis not present

## 2020-12-01 DIAGNOSIS — F411 Generalized anxiety disorder: Secondary | ICD-10-CM

## 2020-12-01 DIAGNOSIS — F4310 Post-traumatic stress disorder, unspecified: Secondary | ICD-10-CM | POA: Diagnosis not present

## 2020-12-01 DIAGNOSIS — F900 Attention-deficit hyperactivity disorder, predominantly inattentive type: Secondary | ICD-10-CM

## 2020-12-01 MED ORDER — AMPHETAMINE-DEXTROAMPHETAMINE 30 MG PO TABS: 30.0000 mg | ORAL_TABLET | Freq: Two times a day (BID) | ORAL | 0 refills | Status: DC

## 2020-12-01 MED ORDER — BUPROPION HCL ER (XL) 300 MG PO TB24: 300.0000 mg | ORAL_TABLET | Freq: Every day | ORAL | 1 refills | Status: DC

## 2020-12-01 NOTE — Progress Notes (Signed)
[facility]  Patient ID: Kelli Pennington,  MRN: [id number]  PCP: Darrow Bussing, MD  Date of Evaluation: 12/01/2020 Time spent:30 minutes  Chief Complaint:  Chief Complaint    Anxiety; ADD; Depression      HISTORY/CURRENT STATUS: HPI for routine med check.  Sabena was last seen about 6 weeks ago.  We had restarted the Wellbutrin as she had been out for a little while.  We restarted at 150 mg when she had been on 300 prior to that.  She is not really sure it is helping and would like to increase it.  She still feels really down, does not want to do anything outside of work.  She is able to push herself to go to work.  Energy and motivation are low.  She does get mad easier and cry easily but those triggers are related to a family situation.  If not for no reason at all.  When a situation like that happen she does get more anxious there is usually a reason for the anxiety.  She rarely takes the Xanax.  Denies suicidal or homicidal thoughts.  The Adderall is working well at this dose.  She is able to focus more and get things done in a timely manner without easily being distracted.  Patient denies increased energy with decreased need for sleep, no increased talkativeness, no racing thoughts, no impulsivity or risky behaviors, no increased spending, no increased libido, no grandiosity, no irritability, no paranoia, and no hallucinations.  Denies dizziness, syncope, seizures, numbness, tingling, tremor, tics, unsteady gait, slurred speech, confusion. Denies muscle or joint pain, stiffness, or dystonia.  Individual Medical History/ Review of Systems: Changes? :No    Past medications for mental health diagnoses include: Depakote, Celexa, Wellbutrin, Klonopin, Prozac, Zoloft, Lexapro, Adderall  Allergies: Fish-derived products and Peanut-containing drug products  Current Medications:  Current Outpatient Medications:  .  ALPRAZolam (XANAX) 0.5 MG tablet, Take 0.5-1 tablets  (0.25-0.5 mg total) by mouth 2 (two) times daily as needed for anxiety., Disp: 30 tablet, Rfl: 1 .  [START ON 12/31/2020] amphetamine-dextroamphetamine (ADDERALL) 30 MG tablet, Take 1 tablet by mouth 2 (two) times daily., Disp: 60 tablet, Rfl: 0 .  citalopram (CELEXA) 40 MG tablet, Take 1 tablet (40 mg total) by mouth daily., Disp: 90 tablet, Rfl: 1 .  divalproex (DEPAKOTE ER) 500 MG 24 hr tablet, TAKE 2 TABLETS AT BEDTIME, Disp: 180 tablet, Rfl: 1 .  gabapentin (NEURONTIN) 300 MG capsule, Take 2 capsules (600 mg total) by mouth at bedtime., Disp: 180 capsule, Rfl: 0 .  lamoTRIgine (LAMICTAL) 100 MG tablet, Take 1 tablet (100 mg total) by mouth daily., Disp: 90 tablet, Rfl: 1 .  levothyroxine (SYNTHROID, LEVOTHROID) 88 MCG tablet, Take 88 mcg by mouth daily before breakfast. , Disp: , Rfl: 2 .  valACYclovir (VALTREX) 1000 MG tablet, Take 1,000 mg by mouth daily as needed. , Disp: , Rfl:  .  albuterol (PROVENTIL HFA;VENTOLIN HFA) 108 (90 Base) MCG/ACT inhaler, Inhale 2 puffs into the lungs every 4 (four) hours as needed for wheezing or shortness of breath. (Patient not taking: No sig reported), Disp: 18 g, Rfl: 1 .  [START ON 01/30/2021] amphetamine-dextroamphetamine (ADDERALL) 30 MG tablet, Take 1 tablet by mouth 2 (two) times daily., Disp: 60 tablet, Rfl: 0 .  amphetamine-dextroamphetamine (ADDERALL) 30 MG tablet, Take 1 tablet by mouth 2 (two) times daily., Disp: 60 tablet, Rfl: 0 .  budesonide-formoterol (SYMBICORT) 80-4.5 MCG/ACT inhaler, Inhale 2 puffs into the lungs 2 (two) times  daily. USE SPACER. (Patient not taking: No sig reported), Disp: 1 Inhaler, Rfl: 5 .  buPROPion (WELLBUTRIN XL) 300 MG 24 hr tablet, Take 1 tablet (300 mg total) by mouth daily., Disp: 90 tablet, Rfl: 1 .  fluticasone (FLONASE) 50 MCG/ACT nasal spray, Two sprays each nostril once a day as needed for nasal congestion or drainage. (Patient not taking: No sig reported), Disp: 16 g, Rfl: 5 Medication Side Effects:  none  Family Medical/ Social History: Changes?  No  MENTAL HEALTH EXAM:  Last menstrual period 12/14/2018.There is no height or weight on file to calculate BMI.  General Appearance: Casual, Neat and Well Groomed  Eye Contact:  Good  Speech:  Clear and Coherent and Normal Rate  Volume:  Normal  Mood:  Depressed  Affect:  Appropriate  Thought Process:  Goal Directed  Orientation:  Full (Time, Place, and Person)  Thought Content: Logical   Suicidal Thoughts:  No  Homicidal Thoughts:  No  Memory:  WNL  Judgement:  Good  Insight:  Good  Psychomotor Activity:  Normal  Concentration:  Concentration: Good and Attention Span: Good  Recall:  Good  Fund of Knowledge: Good  Language: Good  Assets:  Desire for Improvement  ADL's:  Intact  Cognition: WNL  Prognosis:  Good   Most recent pertinent labs: 11/17/2020 CBC with differential was completely normal. CMP AST 9 and ALT 6.  All other results were normal as well. Depakote level was 91 Ammonia was 66 which is normal. Lamictal level is 7.6.   DIAGNOSES:    ICD-10-CM   1. Bipolar I disorder (HCC)  F31.9   2. PTSD (post-traumatic stress disorder)  F43.10   3. Attention deficit hyperactivity disorder (ADHD), predominantly inattentive type  F90.0   4. Generalized anxiety disorder  F41.1     Receiving Psychotherapy: No    RECOMMENDATIONS:  PDMP reviewed.  I provided 30 minutes of face to face time during this encounter, discussing her lab results and treatment options for the current sx of uncontrolled depression.  I do recommend increasing the Wellbutrin.  She did report increased anxiety in the past when she was on higher doses.  But she feels like the benefits are worth it if that occurs again. Increase Wellbutrin XL to 300 mg 1 p.o. daily. Continue Adderall 30 mg 1 twice daily. Continue Xanax 0.5 mg, 1/2-1 po bid prn, take springly. Continue Celexa 40 mg p.o. daily. Continue Depakote ER 500 mg, 2 p.o. nightly. Continue  gabapentin 300 mg, 1 po q am. and 2 p.o. nightly. Continue Lamictal 100 mg daily. Labs ordered as noted above. Return in 4 weeks.   Melony Overly, PA-C

## 2021-01-19 ENCOUNTER — Ambulatory Visit: Payer: 59 | Admitting: Physician Assistant

## 2021-02-16 ENCOUNTER — Other Ambulatory Visit: Payer: Self-pay | Admitting: Physician Assistant

## 2021-03-11 ENCOUNTER — Ambulatory Visit: Payer: 59 | Admitting: Physician Assistant

## 2021-03-25 ENCOUNTER — Emergency Department (HOSPITAL_BASED_OUTPATIENT_CLINIC_OR_DEPARTMENT_OTHER)
Admission: EM | Admit: 2021-03-25 | Discharge: 2021-03-25 | Disposition: A | Discharge status: Home / Self Care | Payer: Managed Care, Other (non HMO) | Attending: Emergency Medicine | Admitting: Emergency Medicine

## 2021-03-25 ENCOUNTER — Other Ambulatory Visit: Payer: Self-pay

## 2021-03-25 ENCOUNTER — Encounter (HOSPITAL_BASED_OUTPATIENT_CLINIC_OR_DEPARTMENT_OTHER): Payer: Self-pay

## 2021-03-25 ENCOUNTER — Emergency Department (HOSPITAL_BASED_OUTPATIENT_CLINIC_OR_DEPARTMENT_OTHER): Payer: Managed Care, Other (non HMO)

## 2021-03-25 DIAGNOSIS — Z7951 Long term (current) use of inhaled steroids: Secondary | ICD-10-CM | POA: Insufficient documentation

## 2021-03-25 DIAGNOSIS — R112 Nausea with vomiting, unspecified: Secondary | ICD-10-CM | POA: Insufficient documentation

## 2021-03-25 DIAGNOSIS — Z79899 Other long term (current) drug therapy: Secondary | ICD-10-CM | POA: Diagnosis not present

## 2021-03-25 DIAGNOSIS — E039 Hypothyroidism, unspecified: Secondary | ICD-10-CM | POA: Diagnosis not present

## 2021-03-25 DIAGNOSIS — J45909 Unspecified asthma, uncomplicated: Secondary | ICD-10-CM | POA: Diagnosis not present

## 2021-03-25 DIAGNOSIS — Z91010 Allergy to peanuts: Secondary | ICD-10-CM | POA: Insufficient documentation

## 2021-03-25 DIAGNOSIS — R079 Chest pain, unspecified: Secondary | ICD-10-CM | POA: Insufficient documentation

## 2021-03-25 DIAGNOSIS — R0602 Shortness of breath: Secondary | ICD-10-CM | POA: Insufficient documentation

## 2021-03-25 LAB — CBC WITH DIFFERENTIAL/PLATELET
Abs Immature Granulocytes: 0.01 10*3/uL (ref 0.00–0.07)
Basophils Absolute: 0 10*3/uL (ref 0.0–0.1)
Basophils Relative: 1 %
Eosinophils Absolute: 0.1 10*3/uL (ref 0.0–0.5)
Eosinophils Relative: 1 %
HCT: 41.2 % (ref 36.0–46.0)
Hemoglobin: 14.5 g/dL (ref 12.0–15.0)
Immature Granulocytes: 0 %
Lymphocytes Relative: 40 %
Lymphs Abs: 2.6 10*3/uL (ref 0.7–4.0)
MCH: 32.3 pg (ref 26.0–34.0)
MCHC: 35.2 g/dL (ref 30.0–36.0)
MCV: 91.8 fL (ref 80.0–100.0)
Monocytes Absolute: 0.5 10*3/uL (ref 0.1–1.0)
Monocytes Relative: 7 %
Neutro Abs: 3.4 10*3/uL (ref 1.7–7.7)
Neutrophils Relative %: 51 %
Platelets: 226 10*3/uL (ref 150–400)
RBC: 4.49 MIL/uL (ref 3.87–5.11)
RDW: 12.2 % (ref 11.5–15.5)
WBC: 6.5 10*3/uL (ref 4.0–10.5)
nRBC: 0 % (ref 0.0–0.2)

## 2021-03-25 LAB — BASIC METABOLIC PANEL
Anion gap: 10 (ref 5–15)
BUN: 18 mg/dL (ref 6–20)
CO2: 25 mmol/L (ref 22–32)
Calcium: 9.3 mg/dL (ref 8.9–10.3)
Chloride: 101 mmol/L (ref 98–111)
Creatinine, Ser: 0.77 mg/dL (ref 0.44–1.00)
GFR, Estimated: >60 mL/min (ref >60)
Glucose, Bld: 100 mg/dL — ABNORMAL HIGH (ref 70–99)
Potassium: 4.1 mmol/L (ref 3.5–5.1)
Sodium: 136 mmol/L (ref 135–145)

## 2021-03-25 LAB — TROPONIN I (HIGH SENSITIVITY)
Troponin I (High Sensitivity): 4 ng/L (ref <18)
Troponin I (High Sensitivity): <2 ng/L (ref <18)

## 2021-03-25 NOTE — ED Provider Notes (Signed)
[facility] EMERGENCY DEPARTMENT Provider Note   CSN: [id number] Arrival date & time: 03/25/21  1738     History Chief Complaint  Patient presents with  . Chest Pain    Kelli Pennington is a 50 y.o. female.  She did not seek evaluation yesterday when the chest pain happened, but today, she felt nonspecifically off.  She had a little bit of shortness of breath and also had some nausea and one episode of vomiting.  Around that time, which was at noon, and she had some diaphoresis.  The history is provided by the patient.  Chest Pain Pain location:  L chest Pain quality: pressure   Pain radiates to:  Does not radiate Pain severity:  Severe Onset quality:  Sudden Duration:  5 minutes Timing:  Constant (occurred >24 hours ago and has not occurred again) Progression:  Resolved Chronicity:  New Context: at rest   Context comment:  Was at the hair dresser Relieved by:  Nothing Worsened by:  Nothing Ineffective treatments:  None tried Associated symptoms: nausea, shortness of breath and vomiting   Associated symptoms: no abdominal pain, no back pain, no cough, no fever and no palpitations        Past Medical History:  Diagnosis Date  . Allergic rhinitis   . Asthma   . Bipolar 2 disorder (HCC)   . Depression   . Hypothyroidism   . Interstitial cystitis     Patient Active Problem List   Diagnosis Date Noted  . Coughing 12/05/2018  . Other allergic rhinitis 12/05/2018  . Adverse food reaction 12/05/2018  . Posttraumatic stress disorder 10/07/2018  . Moderate depressed bipolar I disorder (HCC) 10/07/2018  . ADD (attention deficit disorder) 10/07/2018    Past Surgical History:  Procedure Laterality Date  . CERVICAL DISC SURGERY  02/2018  . IR ANGIO INTRA EXTRACRAN SEL COM CAROTID INNOMINATE BILAT MOD SED  01/15/2019  . IR ANGIO VERTEBRAL SEL VERTEBRAL BILAT MOD SED  01/15/2019  . IR US GUIDE VASC ACCESS RIGHT  01/15/2019  . RADICAL HYSTERECTOMY    .  THYROIDECTOMY, PARTIAL    . VERTICAL BANDED GASTROPLASTY       OB History   No obstetric history on file.     Family History  Problem Relation Age of Onset  . COPD Mother   . Allergic rhinitis Sister   . Angioedema Neg Hx   . Asthma Neg Hx   . Eczema Neg Hx   . Immunodeficiency Neg Hx   . Urticaria Neg Hx     Social History   Tobacco Use  . Smoking status: Never Smoker  . Smokeless tobacco: Never Used  Vaping Use  . Vaping Use: Never used  Substance Use Topics  . Alcohol use: Yes    Comment: rare  . Drug use: Never    Home Medications Prior to Admission medications   Medication Sig Start Date End Date Taking? Authorizing Provider  albuterol (PROVENTIL HFA;VENTOLIN HFA) 108 (90 Base) MCG/ACT inhaler Inhale 2 puffs into the lungs every 4 (four) hours as needed for wheezing or shortness of breath. Patient not taking: No sig reported 12/05/18   Ellamae Sia, DO  ALPRAZolam Prudy Feeler) 0.5 MG tablet Take 0.5-1 tablets (0.25-0.5 mg total) by mouth 2 (two) times daily as needed for anxiety. 10/21/20   Melony Overly T, PA-C  amphetamine-dextroamphetamine (ADDERALL) 30 MG tablet Take 1 tablet by mouth 2 (two) times daily. 01/30/21   Cherie Ouch, PA-C  amphetamine-dextroamphetamine (ADDERALL) 30  MG tablet Take 1 tablet by mouth 2 (two) times daily. 12/31/20   Melony Overly T, PA-C  amphetamine-dextroamphetamine (ADDERALL) 30 MG tablet Take 1 tablet by mouth 2 (two) times daily. 12/01/20   Melony Overly T, PA-C  budesonide-formoterol (SYMBICORT) 80-4.5 MCG/ACT inhaler Inhale 2 puffs into the lungs 2 (two) times daily. USE SPACER. Patient not taking: No sig reported 12/05/18   Ellamae Sia, DO  buPROPion (WELLBUTRIN XL) 300 MG 24 hr tablet Take 1 tablet (300 mg total) by mouth daily. 12/01/20   Melony Overly T, PA-C  citalopram (CELEXA) 40 MG tablet Take 1 tablet (40 mg total) by mouth daily. 10/21/20   Melony Overly T, PA-C  divalproex (DEPAKOTE ER) 500 MG 24 hr tablet TAKE 2 TABLETS AT  BEDTIME 10/21/20   Hurst, Teresa T, PA-C  fluticasone [facility]) 50 MCG/ACT nasal spray Two sprays each nostril once a day as needed for nasal congestion or drainage. Patient not taking: No sig reported 12/05/18   Ellamae Sia, DO  gabapentin (NEURONTIN) 300 MG capsule Take 2 capsules (600 mg total) by mouth at bedtime. 05/26/20   Cherie Ouch, PA-C  lamoTRIgine (LAMICTAL) 100 MG tablet Take 1 tablet (100 mg total) by mouth daily. 10/21/20   Cherie Ouch, PA-C  levothyroxine (SYNTHROID, LEVOTHROID) 88 MCG tablet Take 88 mcg by mouth daily before breakfast.  10/05/18   [provider]  valACYclovir (VALTREX) 1000 MG tablet Take 1,000 mg by mouth daily as needed.  11/27/18   [provider]    Allergies    Fish-derived products and Peanut-containing drug products  Review of Systems   Review of Systems  Constitutional: Negative for chills and fever.  HENT: Negative for ear pain and sore throat.   Eyes: Negative for pain and visual disturbance.  Respiratory: Positive for shortness of breath. Negative for cough.   Cardiovascular: Positive for chest pain. Negative for palpitations.  Gastrointestinal: Positive for nausea and vomiting. Negative for abdominal pain.  Genitourinary: Negative for dysuria and hematuria.  Musculoskeletal: Negative for arthralgias and back pain.  Skin: Negative for color change and rash.  Neurological: Negative for seizures and syncope.  All other systems reviewed and are negative.   Physical Exam Updated Vital Signs BP (!) 152/109 (BP Location: Left Arm)   Pulse (!) 103   Temp 98.6 F (37 C) (Oral)   Resp 20   Ht 5\' 6"  (1.676 m)   Wt 68 kg   LMP 12/14/2018 Comment: hysterectomy  SpO2 100%   BMI 24.21 kg/m   Physical Exam Vitals and nursing note reviewed.  Constitutional:      General: She is not in acute distress.    Appearance: She is well-developed.  HENT:     Head: Normocephalic and atraumatic.  Eyes:     Conjunctiva/sclera:  Conjunctivae normal.  Cardiovascular:     Rate and Rhythm: Normal rate and regular rhythm.     Heart sounds: No murmur heard.   Pulmonary:     Effort: Pulmonary effort is normal. No respiratory distress.     Breath sounds: Normal breath sounds.  Abdominal:     Palpations: Abdomen is soft.     Tenderness: There is no abdominal tenderness.  Musculoskeletal:     Cervical back: Neck supple.  Skin:    General: Skin is warm and dry.  Neurological:     General: No focal deficit present.     Mental Status: She is alert.  Psychiatric:  Mood and Affect: Mood normal.     ED Results / Procedures / Treatments   Labs (all labs ordered are listed, but only abnormal results are displayed) Labs Reviewed  BASIC METABOLIC PANEL - Abnormal; Notable for the following components:      Result Value   Glucose, Bld 100 (*)    All other components within normal limits  CBC WITH DIFFERENTIAL/PLATELET  TROPONIN I (HIGH SENSITIVITY)  TROPONIN I (HIGH SENSITIVITY)    EKG EKG Interpretation  Date/Time:  Thursday March 25 2021 17:45:48 EDT Ventricular Rate:  100 PR Interval:  144 QRS Duration: 78 QT Interval:  368 QTC Calculation: 474 R Axis:   63 Text Interpretation: Normal sinus rhythm Normal ECG normal axis no acute ischemia No prior for comparison Confirmed by Pieter Partridge ([id number]) on 03/25/2021 5:52:14 PM   Radiology DG Chest 2 View  Result Date: 03/25/2021 CLINICAL DATA:  Chest pain. EXAM: CHEST - 2 VIEW COMPARISON:  February 20, 2018 FINDINGS: Mild, chronic appearing increased interstitial lung markings are seen with mild scarring and/or atelectasis also noted along the lateral aspect of the right lung base. A 3 mm calcified right upper lobe lung nodule is present. There is no evidence of a pleural effusion or pneumothorax. The heart size and mediastinal contours are within normal limits. A radiopaque fusion plate and screws are seen overlying the lower cervical spine. This represents a new  finding when compared to the prior study. The visualized skeletal structures are otherwise unremarkable. IMPRESSION: Chronic appearing increased interstitial lung markings with mild right basilar scarring and/or atelectasis. Electronically Signed   By: Aram Candela M.D.   On: 03/25/2021 18:05    Procedures Procedures   Medications Ordered in ED Medications - No data to display  ED Course  I have reviewed the triage vital signs and the nursing notes.  Pertinent labs & imaging results that were available during my care of the patient were reviewed by me and considered in my medical decision making (see chart for details).    MDM Rules/Calculators/A&P                          Charnelle Bergeman presented more than 24 hours after an onset of chest pain that lasted for 5 minutes and has not recurred since then.  She had a normal ED work-up including serial troponins.  According to the heart pathway, she meets criteria for discharge home.  Initially she was a little bit tachycardic and mildly hypertensive.  Presentation was not consistent with PE, aortic dissection, infection, CHF, referred abdominal pain.  Vital signs had normalized at discharge. Final Clinical Impression(s) / ED Diagnoses Final diagnoses:  Chest pain, unspecified type    Rx / DC Orders ED Discharge Orders    None       Koleen Distance, MD 03/25/21 2044

## 2021-03-25 NOTE — ED Provider Notes (Signed)
MSE was initiated and I personally evaluated the patient and placed orders (if any) at  5:45 PM on March 25, 2021.  The patient appears stable so that the remainder of the MSE may be completed by another provider.   Patient placed in Quick Look pathway, seen and evaluated   Chief Complaint: cp  HPI:   50 y.o. F who presents for evaluation of CP. She reports that yesterday when she was at the hairdresser, she started developing chest pain. She describes it as a sudden "thump" to the middle of her chest. She had associated SOB but no nausea/vomiting. Pain resolved by itself after 15 minutes. She has had intermittent symptoms today. Currently denies any CP. But states she feels tired and fatigued. No history of MI. No family history of MI before age 34.   ROS: CP   Physical Exam:   Gen: No distress  Neuro: Awake and Alert  Skin: Warm    Focused Exam: RRR, CTAB   Initiation of care has begun. The patient has been counseled on the process, plan, and necessity for staying for the completion/evaluation, and the remainder of the medical screening examination    Rosana Hoes 03/25/21 1746    Koleen Distance, MD 03/25/21 2213

## 2021-03-25 NOTE — ED Triage Notes (Signed)
Pt c/o intermittent CP started while she was seated in chair yesterday-denies CP at present-NAD-steady gait-PA in triage for MSE

## 2021-03-27 IMAGING — CT CT BIOPSY
2 of 5 series · 11 of 36 positions shown, 18 images · non-contrast
Comparison: none

CLINICAL DATA: Posterior pelvic pain on the left. Suspected
Bertolotti syndrome.

[Series 3: needle -guided injection · axial · 0.75mm/px · z∈[+791,+919]mm · 10 of 78 slices shown, 16 images]
[im 7/78  soft-tissue]
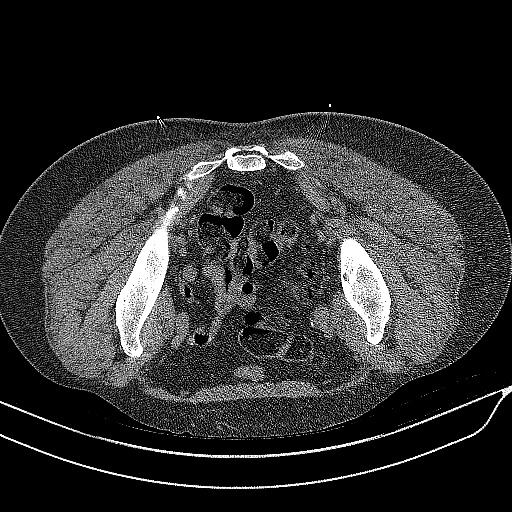
[im 7/78  bone]
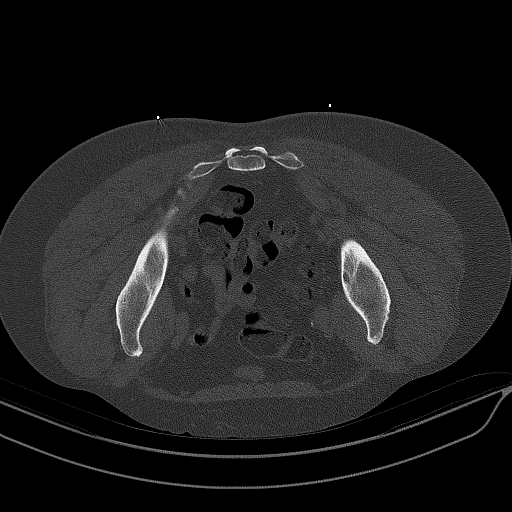
[im 13/78  soft-tissue]
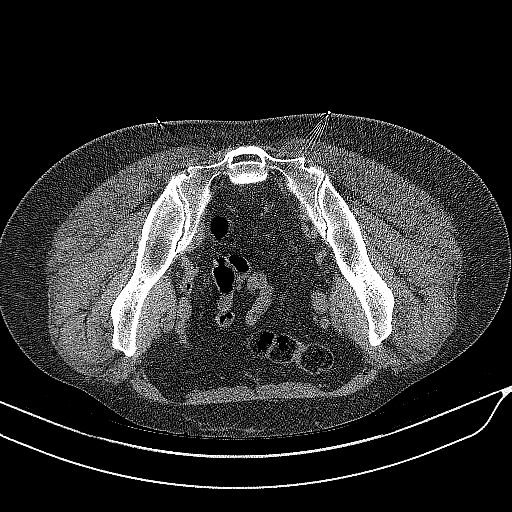
[im 20/78  soft-tissue]
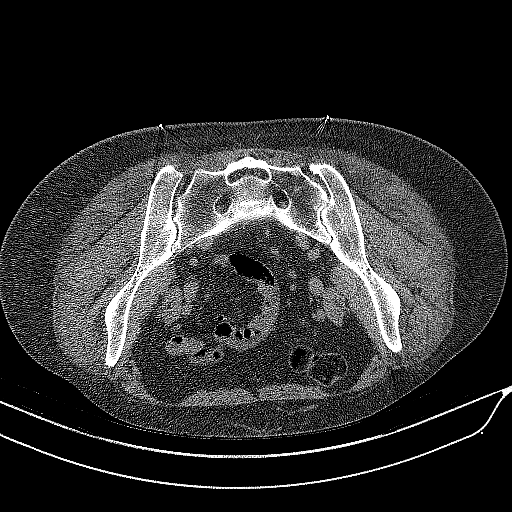
[im 26/78  soft-tissue]
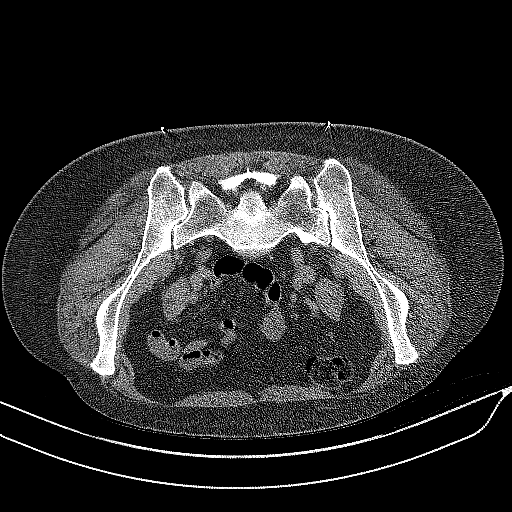
[im 33/78  soft-tissue]
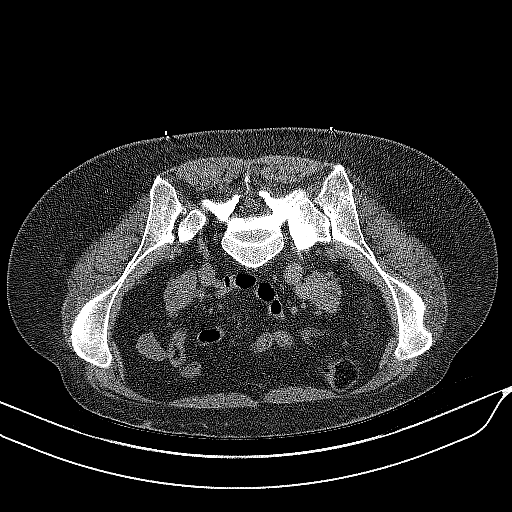
[im 45/78  soft-tissue]
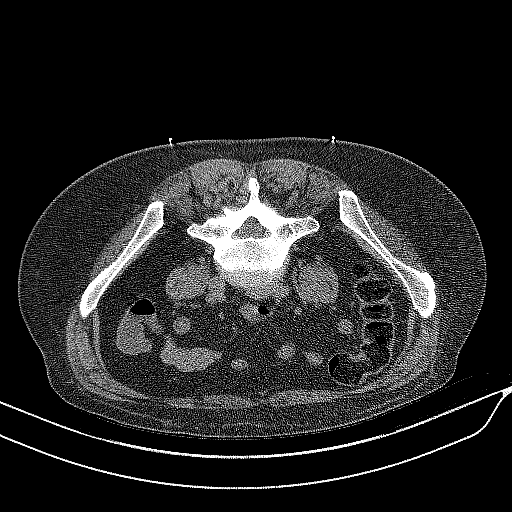
[im 52/78  soft-tissue]
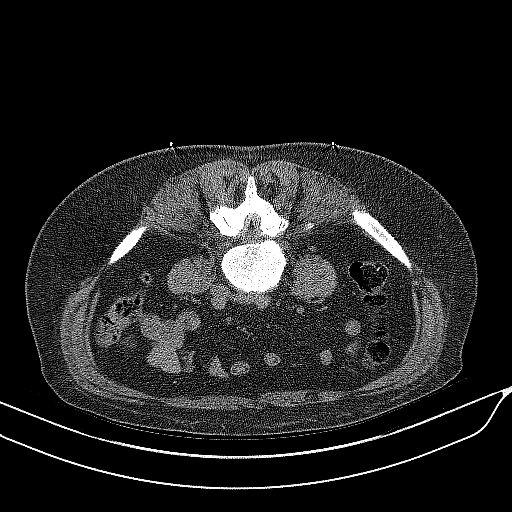
[im 52/78  lung]
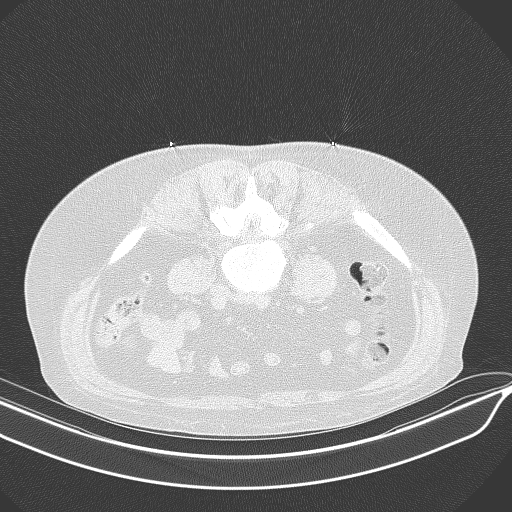
[im 58/78  soft-tissue]
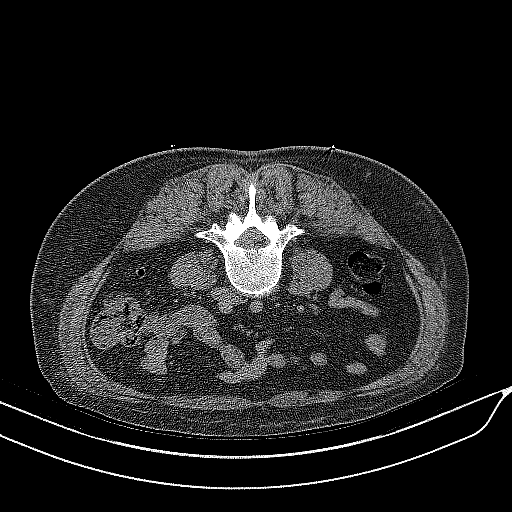
[im 58/78  lung]
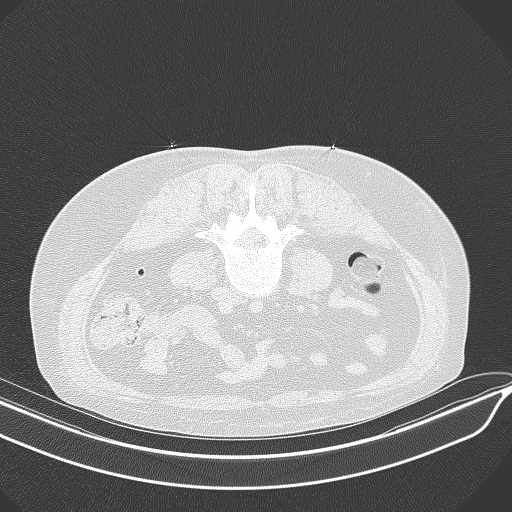
[im 65/78  soft-tissue]
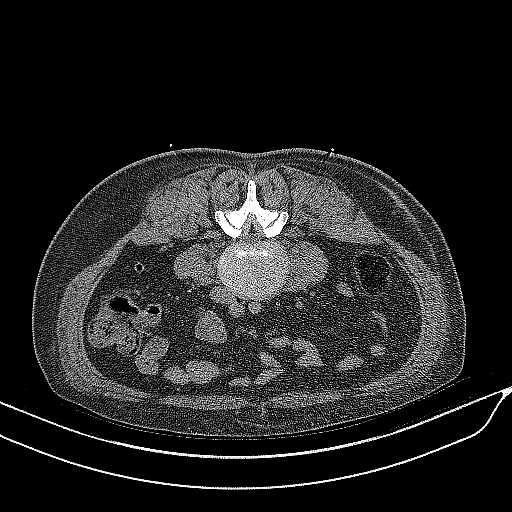
[im 65/78  lung]
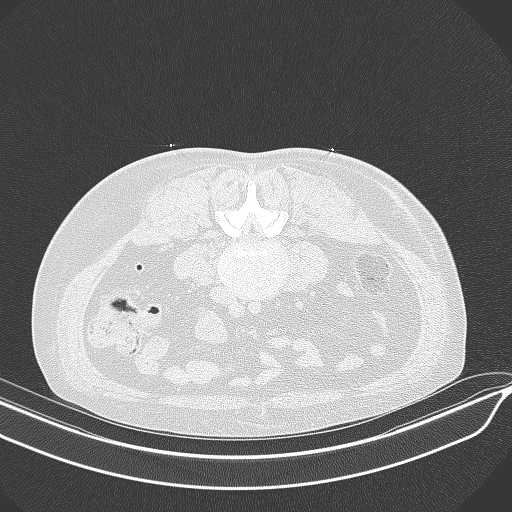
[im 65/78  bone]
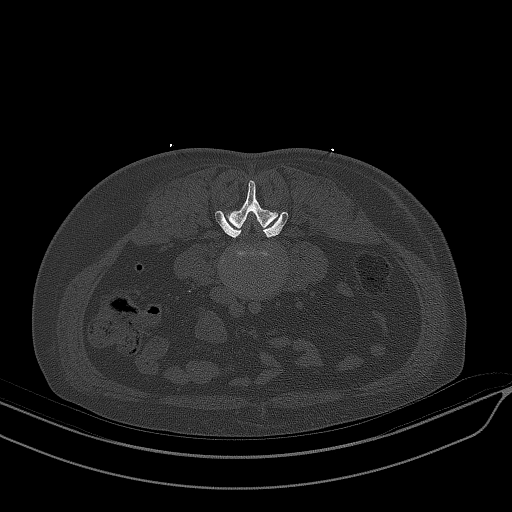
[im 71/78  soft-tissue]
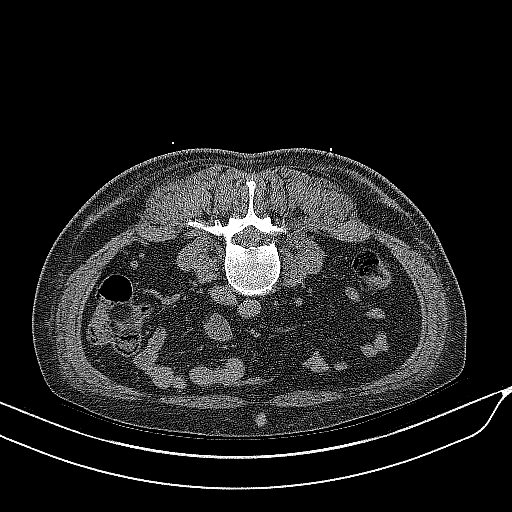
[im 71/78  lung]
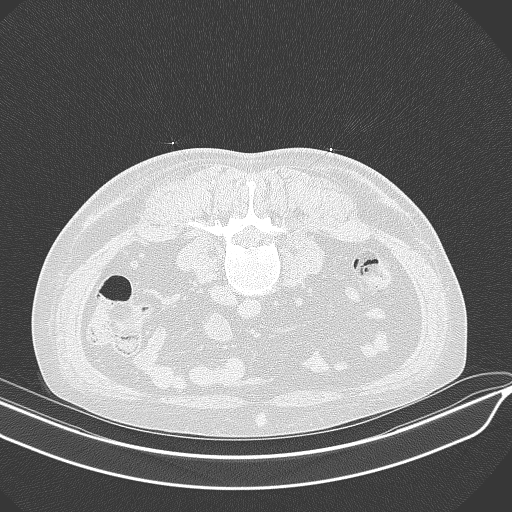

[Series 4: sagittal · sagittal · 0.48mm/px · 1 of 193 slices shown, 2 images]
[im 65/193  soft-tissue]
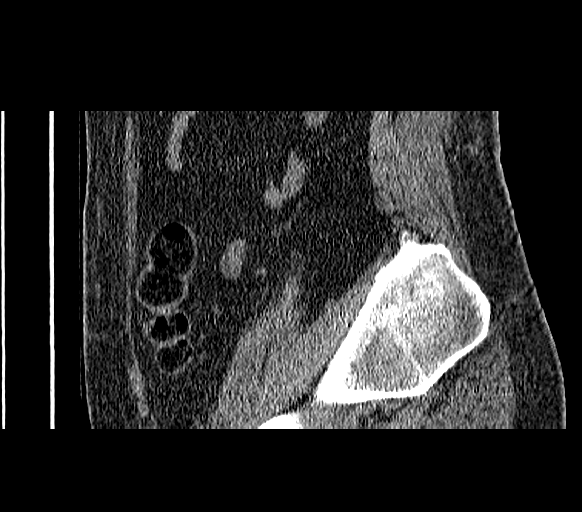
[im 65/193  bone]
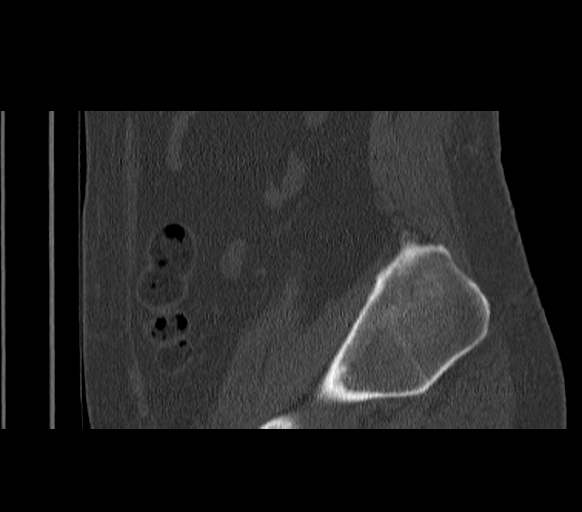

[11 of 36 positions shown; findings below may reference images not displayed]

EXAM:
Left CT GUIDED SI JOINT INJECTION

PROCEDURE:
After a thorough discussion of risks and benefits of the procedure,
including bleeding, infection, injury to nerves, blood vessels, and
adjacent structures, verbal and written consent was obtained.
Specific risks of the procedure included
nondiagnostic/nontherapeutic injection and non target injection. The
patient was placed prone on the CT table and localization was
performed over the sacrum.

Initial images demonstrate fusion from the left transverse process
of L5 to the sacrum. After discussion with Dr. Paulus N, we decided to
proceed with left SI injection.

Target site marked using CT guidance. The skin was prepped and
draped in the usual sterile fashion using Betadine soap.

After local anesthesia with 1% lidocaine without epinephrine and
subsequent deep anesthesia, a 22 gauge spinal needle was advanced
into the left SI joint under intermittent CT guidance.

Once the needle was in satisfactory position, representative image
was captured with the needle demonstrated in the sacroiliac joint. A
confirm location in the joint with injection of 1 mL of Isovue M
200. Subsequently, 2 mL 0.5% bupivacaine was injected into the left
SI joint. Needles removed and a sterile dressing applied.

Upon getting off of the CT table, she head acute posterior pelvic
pain while attempting to extend her left hip. This appeared to be
muscle spasm related to the injection. Choose treated with 30 mg
Toradol IM. The spasm improved in the patient was able to ambulate.
She was discharged in stable condition.
IMPRESSION: Successful CT-guided left anesthetic only SI joint injection.

## 2021-04-15 ENCOUNTER — Other Ambulatory Visit: Payer: Self-pay

## 2021-04-15 ENCOUNTER — Telehealth: Payer: Self-pay | Admitting: Physician Assistant

## 2021-04-15 NOTE — Telephone Encounter (Signed)
pended

## 2021-04-15 NOTE — Telephone Encounter (Signed)
Kelli Pennington called to request refill of her Adderall.  Appt. 05/14/21.  [facility] on [address], [place], [place]

## 2021-04-16 MED ORDER — AMPHETAMINE-DEXTROAMPHETAMINE 30 MG PO TABS: 30.0000 mg | ORAL_TABLET | Freq: Two times a day (BID) | ORAL | 0 refills | Status: DC

## 2021-04-16 NOTE — Telephone Encounter (Signed)
Will send in now, but last one unless she's seen

## 2021-04-21 ENCOUNTER — Other Ambulatory Visit: Payer: Self-pay | Admitting: Orthopedic Surgery

## 2021-04-21 DIAGNOSIS — M533 Sacrococcygeal disorders, not elsewhere classified: Secondary | ICD-10-CM

## 2021-04-23 ENCOUNTER — Other Ambulatory Visit: Payer: Self-pay | Admitting: Orthopedic Surgery

## 2021-04-23 ENCOUNTER — Ambulatory Visit
Admission: RE | Admit: 2021-04-23 | Discharge: 2021-04-23 | Disposition: A | Discharge status: Home / Self Care | Payer: Self-pay | Source: Ambulatory Visit | Attending: Orthopedic Surgery | Admitting: Orthopedic Surgery

## 2021-04-23 DIAGNOSIS — R52 Pain, unspecified: Secondary | ICD-10-CM

## 2021-05-14 ENCOUNTER — Encounter: Payer: Self-pay | Admitting: Physician Assistant

## 2021-05-14 ENCOUNTER — Other Ambulatory Visit: Payer: Self-pay

## 2021-05-14 ENCOUNTER — Ambulatory Visit (INDEPENDENT_AMBULATORY_CARE_PROVIDER_SITE_OTHER): Payer: 59 | Admitting: Physician Assistant

## 2021-05-14 DIAGNOSIS — F900 Attention-deficit hyperactivity disorder, predominantly inattentive type: Secondary | ICD-10-CM | POA: Diagnosis not present

## 2021-05-14 DIAGNOSIS — F411 Generalized anxiety disorder: Secondary | ICD-10-CM | POA: Diagnosis not present

## 2021-05-14 DIAGNOSIS — F4310 Post-traumatic stress disorder, unspecified: Secondary | ICD-10-CM | POA: Diagnosis not present

## 2021-05-14 DIAGNOSIS — F319 Bipolar disorder, unspecified: Secondary | ICD-10-CM | POA: Diagnosis not present

## 2021-05-14 MED ORDER — AMPHETAMINE-DEXTROAMPHETAMINE 30 MG PO TABS: 30.0000 mg | ORAL_TABLET | Freq: Two times a day (BID) | ORAL | 0 refills | Status: DC

## 2021-05-14 MED ORDER — ALPRAZOLAM 0.5 MG PO TABS: 0.2500 mg | ORAL_TABLET | Freq: Two times a day (BID) | ORAL | 1 refills | Status: DC | PRN

## 2021-05-14 MED ORDER — DIVALPROEX SODIUM ER 500 MG PO TB24: ORAL_TABLET | ORAL | 1 refills | Status: DC

## 2021-05-14 MED ORDER — LAMOTRIGINE 100 MG PO TABS: 100.0000 mg | ORAL_TABLET | Freq: Every day | ORAL | 1 refills | Status: DC

## 2021-05-14 MED ORDER — CITALOPRAM HYDROBROMIDE 20 MG PO TABS: ORAL_TABLET | ORAL | 0 refills | Status: DC

## 2021-05-14 MED ORDER — BUPROPION HCL ER (XL) 300 MG PO TB24: 300.0000 mg | ORAL_TABLET | Freq: Every day | ORAL | 1 refills | Status: DC

## 2021-05-14 NOTE — Progress Notes (Signed)
[facility]  Patient ID: Kelli Pennington,  MRN: [id number]  PCP: Darrow Bussing, MD  Date of Evaluation: 05/14/2021 Time spent:30 minutes  Chief Complaint:  Chief Complaint    Anxiety; ADD; Depression; Follow-up      HISTORY/CURRENT STATUS: HPI For routine med check.  A lot of changes going on. She's decided she wants to get a divorce. Her husband, she thinks, is asexual. Has been a problem for a long time. She would like to wean off Celexa as it has caused sexual side effects for her, which 'hasn't really mattered.' But it may in the future. Wants to see how well she does off it too.  "I feel great about getting a divorce. I feel in control of my life." Plans to move back to [place] at some point.  Patient denies loss of interest in usual activities and is able to enjoy things.  Denies decreased energy or motivation.  Appetite has not changed.  No extreme sadness, tearfulness, or feelings of hopelessness.  Denies any changes in concentration, making decisions or remembering things. Adderall is working well.  Denies suicidal or homicidal thoughts.  Patient denies increased energy with decreased need for sleep, no increased talkativeness, no racing thoughts, no impulsivity or risky behaviors, no increased spending, no increased libido, no grandiosity, no increased irritability or anger, and no hallucinations.  Anxiety is well controlled. Using the Xanax rarely.  Denies dizziness, syncope, seizures, numbness, tingling, tremor, tics, unsteady gait, slurred speech, confusion. Denies muscle or joint pain, stiffness, or dystonia.  Individual Medical History/ Review of Systems: Changes? :No   Past medications for mental health diagnoses include: Depakote, Celexa, Wellbutrin, Klonopin, Prozac, Zoloft, Lexapro, Adderall  Allergies: Fish-derived products and Peanut-containing drug products  Current Medications:  Current Outpatient Medications:  .  citalopram (CELEXA) 20 MG  tablet, Take 1-1/2 pills daily for 1 week then decrease to 1 pill daily for 1 week, and then decrease to 1/2 pill daily for 1 week and then stop, Disp: 21 tablet, Rfl: 0 .  gabapentin (NEURONTIN) 300 MG capsule, Take 2 capsules (600 mg total) by mouth at bedtime., Disp: 180 capsule, Rfl: 0 .  levothyroxine (SYNTHROID, LEVOTHROID) 88 MCG tablet, Take 88 mcg by mouth daily before breakfast. , Disp: , Rfl: 2 .  valACYclovir (VALTREX) 1000 MG tablet, Take 1,000 mg by mouth daily as needed. , Disp: , Rfl:  .  albuterol (PROVENTIL HFA;VENTOLIN HFA) 108 (90 Base) MCG/ACT inhaler, Inhale 2 puffs into the lungs every 4 (four) hours as needed for wheezing or shortness of breath. (Patient not taking: No sig reported), Disp: 18 g, Rfl: 1 .  ALPRAZolam (XANAX) 0.5 MG tablet, Take 0.5-1 tablets (0.25-0.5 mg total) by mouth 2 (two) times daily as needed for anxiety., Disp: 30 tablet, Rfl: 1 .  [START ON 07/12/2021] amphetamine-dextroamphetamine (ADDERALL) 30 MG tablet, Take 1 tablet by mouth 2 (two) times daily., Disp: 60 tablet, Rfl: 0 .  [START ON 06/13/2021] amphetamine-dextroamphetamine (ADDERALL) 30 MG tablet, Take 1 tablet by mouth 2 (two) times daily., Disp: 60 tablet, Rfl: 0 .  amphetamine-dextroamphetamine (ADDERALL) 30 MG tablet, Take 1 tablet by mouth 2 (two) times daily., Disp: 60 tablet, Rfl: 0 .  budesonide-formoterol (SYMBICORT) 80-4.5 MCG/ACT inhaler, Inhale 2 puffs into the lungs 2 (two) times daily. USE SPACER. (Patient not taking: No sig reported), Disp: 1 Inhaler, Rfl: 5 .  buPROPion (WELLBUTRIN XL) 300 MG 24 hr tablet, Take 1 tablet (300 mg total) by mouth daily., Disp: 90 tablet, Rfl: 1 .  divalproex (DEPAKOTE ER) 500 MG 24 hr tablet, TAKE 2 TABLETS AT BEDTIME, Disp: 180 tablet, Rfl: 1 .  fluticasone (FLONASE) 50 MCG/ACT nasal spray, Two sprays each nostril once a day as needed for nasal congestion or drainage. (Patient not taking: No sig reported), Disp: 16 g, Rfl: 5 .  lamoTRIgine (LAMICTAL) 100  MG tablet, Take 1 tablet (100 mg total) by mouth daily., Disp: 90 tablet, Rfl: 1 Medication Side Effects: sexual dysfunction  Family Medical/ Social History: Changes?  Getting a divorce.  MENTAL HEALTH EXAM:  Last menstrual period 12/14/2018.There is no height or weight on file to calculate BMI.  General Appearance: Casual and Well Groomed  Eye Contact:  Good  Speech:  Clear and Coherent and Normal Rate  Volume:  Normal  Mood:  Euthymic  Affect:  Appropriate  Thought Process:  Goal Directed and Descriptions of Associations: Circumstantial  Orientation:  Full (Time, Place, and Person)  Thought Content: Logical   Suicidal Thoughts:  No  Homicidal Thoughts:  No  Memory:  WNL  Judgement:  Good  Insight:  Good  Psychomotor Activity:  Normal  Concentration:  Concentration: Good and Attention Span: Good  Recall:  Good  Fund of Knowledge: Good  Language: Good  Assets:  Desire for Improvement  ADL's:  Intact  Cognition: WNL  Prognosis:  Good   Labs discussed with patient: 03/25/2021 CBC w/ diff is normal. BMP Glu 100, all other labs nl.  11/17/2020   VPA 91, LFTs were nl  DIAGNOSES:    ICD-10-CM   1. Bipolar I disorder (HCC)  F31.9   2. PTSD (post-traumatic stress disorder)  F43.10   3. Attention deficit hyperactivity disorder (ADHD), predominantly inattentive type  F90.0   4. Generalized anxiety disorder  F41.1     Receiving Psychotherapy: No    RECOMMENDATIONS:  PDMP was reviewed. I provided 30 minutes of face to face time during this encounter, including time spent before and after the visit in records review, medical decision making, and charting.  We discussed the sexual side effects of SSRIs.  She would like to wean off and see how well she does with mood without it.  Otherwise no changes are necessary. Continue Xanax 0.5 mg, 1/2-1 p.o. twice daily as needed.  Use sparingly. Continue Adderall 30 mg, 1 p.o. every morning and 1 p.o. q. afternoon. Continue Wellbutrin  XL 300 mg, 1 p.o. every morning. Wean off Celexa 20 mg by taking 1.5 pills daily for 1 week, then 1 p.o. daily for 1 week, half pill daily for 1 week and then stop. Continue Depakote ER 500 mg, 2 p.o. nightly. Continue gabapentin 300 mg, 2 p.o. nightly. Continue Lamictal 100 mg, 1 p.o. daily. Return in 6 months.  Melony Overly, PA-C

## 2021-06-05 ENCOUNTER — Other Ambulatory Visit: Payer: Self-pay | Admitting: Physician Assistant

## 2021-06-25 ENCOUNTER — Other Ambulatory Visit: Payer: Self-pay | Admitting: Physician Assistant

## 2021-09-23 ENCOUNTER — Other Ambulatory Visit: Payer: Self-pay

## 2021-09-23 ENCOUNTER — Telehealth: Payer: Self-pay | Admitting: Psychiatry

## 2021-09-23 NOTE — Telephone Encounter (Signed)
Pt called and would like Adderall script refilled. She has confirmed availability at   [id number], [place] - [address]  [address], [place] [address]  Phone:  [phone]  Fax:  [phone]   Next appt 11/29

## 2021-09-23 NOTE — Telephone Encounter (Signed)
Pended.

## 2021-09-24 MED ORDER — AMPHETAMINE-DEXTROAMPHETAMINE 30 MG PO TABS: 30.0000 mg | ORAL_TABLET | Freq: Two times a day (BID) | ORAL | 0 refills | Status: DC

## 2021-11-16 ENCOUNTER — Telehealth: Payer: Self-pay | Admitting: Physician Assistant

## 2021-11-16 ENCOUNTER — Telehealth: Payer: 59 | Admitting: Physician Assistant

## 2021-11-16 ENCOUNTER — Other Ambulatory Visit: Payer: Self-pay

## 2021-11-16 NOTE — Telephone Encounter (Signed)
Pended.

## 2021-11-16 NOTE — Telephone Encounter (Signed)
We had to cancel her appt with TH today. Please send in her Adderall 30mg  RF to : [id number] - [place], [place] - [address]  Appt 1/11

## 2021-11-17 ENCOUNTER — Other Ambulatory Visit: Payer: Self-pay | Admitting: Physician Assistant

## 2021-11-18 MED ORDER — AMPHETAMINE-DEXTROAMPHETAMINE 30 MG PO TABS: 30.0000 mg | ORAL_TABLET | Freq: Two times a day (BID) | ORAL | 0 refills | Status: DC

## 2021-12-20 ENCOUNTER — Other Ambulatory Visit: Payer: Self-pay | Admitting: Physician Assistant

## 2021-12-21 NOTE — Telephone Encounter (Signed)
90 day ok?

## 2021-12-29 ENCOUNTER — Encounter: Payer: Self-pay | Admitting: Physician Assistant

## 2021-12-29 ENCOUNTER — Ambulatory Visit (INDEPENDENT_AMBULATORY_CARE_PROVIDER_SITE_OTHER): Payer: 59 | Admitting: Physician Assistant

## 2021-12-29 DIAGNOSIS — F411 Generalized anxiety disorder: Secondary | ICD-10-CM

## 2021-12-29 DIAGNOSIS — F4310 Post-traumatic stress disorder, unspecified: Secondary | ICD-10-CM

## 2021-12-29 DIAGNOSIS — Z6379 Other stressful life events affecting family and household: Secondary | ICD-10-CM

## 2021-12-29 DIAGNOSIS — F900 Attention-deficit hyperactivity disorder, predominantly inattentive type: Secondary | ICD-10-CM | POA: Diagnosis not present

## 2021-12-29 DIAGNOSIS — F319 Bipolar disorder, unspecified: Secondary | ICD-10-CM

## 2021-12-29 MED ORDER — AMPHETAMINE-DEXTROAMPHETAMINE 30 MG PO TABS: 30.0000 mg | ORAL_TABLET | Freq: Two times a day (BID) | ORAL | 0 refills | Status: DC

## 2021-12-29 MED ORDER — GABAPENTIN 300 MG PO CAPS: 600.0000 mg | ORAL_CAPSULE | Freq: Every day | ORAL | 1 refills | Status: DC

## 2021-12-29 MED ORDER — LAMOTRIGINE 100 MG PO TABS: 100.0000 mg | ORAL_TABLET | Freq: Every day | ORAL | 1 refills | Status: DC

## 2021-12-29 NOTE — Progress Notes (Signed)
[facility]  Patient ID: Kelli Pennington,  MRN: [id number]  PCP: Lujean Amel, MD  Date of Evaluation: 12/29/2021 Time spent:30 minutes  Chief Complaint:  Chief Complaint   Anxiety; Depression; Follow-up    Virtual Visit via Telehealth  I connected with patient by telephone, with their informed consent, and verified patient privacy and that I am speaking with the correct person using two identifiers.  I am private, in my office and the patient is at work.  I discussed the limitations, risks, security and privacy concerns of performing an evaluation and management service by telephone and the availability of in person appointments. I also discussed with the patient that there may be a patient responsible charge related to this service. The patient expressed understanding and agreed to proceed.   I discussed the assessment and treatment plan with the patient. The patient was provided an opportunity to ask questions and all were answered. The patient agreed with the plan and demonstrated an understanding of the instructions.   The patient was advised to call back or seek an in-person evaluation if the symptoms worsen or if the condition fails to improve as anticipated.  I provided 30 minutes of non-face-to-face time during this encounter.   HISTORY/CURRENT STATUS: HPI For routine med check.  Has been off Celexa for about 6 months. Is 'feeling' again.  She does cry easily like when she is reading a book and gets into it, will have sympathy for one of the characters, or she is watching TV the same sort of thing will happen.  She was on Celexa for so long she wonders if she has all of this pent up emotion that is finally able to come out.  She feels like her medications are working though and that she does not need or want to make any changes.  She is under a lot of stress, her mom is sick.  See social history.  Work is going well though.  She is able to enjoy things when she  gets a chance but does state she does things for other people before she will do it for herself.  Energy and motivation are normal.  She is not isolating.  ADLs and personal hygiene are normal.  Sleeps well most of the time.  Denies suicidal or homicidal thoughts.  Anxiety is well controlled. Using the Xanax rarely.  She has needed at some point in dealing with things about her mom.  She tries not to take them very often.  States that attention is good without easy distractibility.  Able to focus on things and finish tasks to completion.   Patient denies increased energy with decreased need for sleep, no increased talkativeness, no racing thoughts, no impulsivity or risky behaviors, no increased spending, no increased libido, no grandiosity, no increased irritability or anger, no paranoia, and no hallucinations.  Denies dizziness, syncope, seizures, numbness, tingling, tremor, tics, unsteady gait, slurred speech, confusion. Denies muscle or joint pain, stiffness, or dystonia.  Individual Medical History/ Review of Systems: Changes? :No   Past medications for mental health diagnoses include: Depakote, Celexa, Wellbutrin, Klonopin, Prozac, Zoloft, Lexapro, Adderall  Allergies: Fish-derived products and Peanut-containing drug products  Current Medications:  Current Outpatient Medications:    albuterol (PROVENTIL HFA;VENTOLIN HFA) 108 (90 Base) MCG/ACT inhaler, Inhale 2 puffs into the lungs every 4 (four) hours as needed for wheezing or shortness of breath., Disp: 18 g, Rfl: 1   ALPRAZolam (XANAX) 0.5 MG tablet, TAKE 0.5-1 TABLETS (0.25-0.5 MG TOTAL) BY  MOUTH 2 (TWO) TIMES DAILY AS NEEDED FOR ANXIETY., Disp: 30 tablet, Rfl: 1   buPROPion (WELLBUTRIN XL) 300 MG 24 hr tablet, TAKE 1 TABLET BY MOUTH EVERY DAY, Disp: 90 tablet, Rfl: 0   divalproex (DEPAKOTE ER) 500 MG 24 hr tablet, TAKE 2 TABLETS BY MOUTH EVERY DAY AT BEDTIME, Disp: 180 tablet, Rfl: 0   levothyroxine (SYNTHROID, LEVOTHROID) 88 MCG  tablet, Take 88 mcg by mouth daily before breakfast., Disp: , Rfl: 2   valACYclovir (VALTREX) 1000 MG tablet, Take 1,000 mg by mouth daily as needed. , Disp: , Rfl:    [START ON 02/24/2022] amphetamine-dextroamphetamine (ADDERALL) 30 MG tablet, Take 1 tablet by mouth 2 (two) times daily., Disp: 60 tablet, Rfl: 0   [START ON 01/28/2022] amphetamine-dextroamphetamine (ADDERALL) 30 MG tablet, Take 1 tablet by mouth 2 (two) times daily., Disp: 60 tablet, Rfl: 0   amphetamine-dextroamphetamine (ADDERALL) 30 MG tablet, Take 1 tablet by mouth 2 (two) times daily., Disp: 60 tablet, Rfl: 0   budesonide-formoterol (SYMBICORT) 80-4.5 MCG/ACT inhaler, Inhale 2 puffs into the lungs 2 (two) times daily. USE SPACER. (Patient not taking: Reported on 10/21/2020), Disp: 1 Inhaler, Rfl: 5   fluticasone (FLONASE) 50 MCG/ACT nasal spray, Two sprays each nostril once a day as needed for nasal congestion or drainage. (Patient not taking: Reported on 10/21/2020), Disp: 16 g, Rfl: 5   gabapentin (NEURONTIN) 300 MG capsule, Take 2 capsules (600 mg total) by mouth at bedtime., Disp: 180 capsule, Rfl: 1   lamoTRIgine (LAMICTAL) 100 MG tablet, Take 1 tablet (100 mg total) by mouth daily., Disp: 90 tablet, Rfl: 1 Medication Side Effects: sexual dysfunction  Family Medical/ Social History: Changes?  Getting a divorce. Mom has been sick. Lives in [place], she's been traveling there several times a month to help out.   MENTAL HEALTH EXAM:  Last menstrual period 12/14/2018.There is no height or weight on file to calculate BMI.  General Appearance:  Unable to assess  Eye Contact:   Unable to assess  Speech:  Clear and Coherent and Normal Rate  Volume:  Normal  Mood:  Euthymic  Affect:   Unable to assess  Thought Process:  Goal Directed and Descriptions of Associations: Circumstantial  Orientation:  Full (Time, Place, and Person)  Thought Content: Logical   Suicidal Thoughts:  No  Homicidal Thoughts:  No  Memory:  WNL   Judgement:  Good  Insight:  Good  Psychomotor Activity:   Unable to assess  Concentration:  Concentration: Good and Attention Span: Good  Recall:  Good  Fund of Knowledge: Good  Language: Good  Assets:  Desire for Improvement  ADL's:  Intact  Cognition: WNL  Prognosis:  Good   09/03/2021 See results on chart. CBC nl, including plts CMP nl, glucose at 85 and LFTs normal Lipid Chol 232, HDL 78, LDL 137 Trig 83 VPA 66.6  DIAGNOSES:    ICD-10-CM   1. Bipolar I disorder ([place])  F31.9     2. PTSD (post-traumatic stress disorder)  F43.10     3. Attention deficit hyperactivity disorder (ADHD), predominantly inattentive type  F90.0     4. Generalized anxiety disorder  F41.1     5. Sickness in family  Z63.79        Receiving Psychotherapy: No    RECOMMENDATIONS:  PDMP was reviewed. Last Xanax filled 11/18/2021. Last Adderall filled 11/18/2021. I provided 30 minutes of non-face-to-face time during this encounter, including time spent before and after the visit in records review, medical  decision making, counseling pertinent to today's visit, and charting.  I am sorry to hear about her mom but hope she recovers well. Carisse is stable on the current treatment so no changes will be made. Continue Xanax 0.5 mg, 1/2-1 p.o. twice daily as needed.  Use sparingly. Continue Adderall 30 mg, 1 p.o. every morning and 1 p.o. q. afternoon. Continue Wellbutrin XL 300 mg, 1 p.o. every morning. Continue Depakote ER 500 mg, 2 p.o. nightly. Continue gabapentin 300 mg, 2 p.o. nightly. Continue Lamictal 100 mg, 1 p.o. daily. Return in 6 months.  Donnal Moat, PA-C

## 2022-01-24 ENCOUNTER — Other Ambulatory Visit: Payer: Self-pay

## 2022-01-24 ENCOUNTER — Telehealth: Payer: Self-pay | Admitting: Physician Assistant

## 2022-01-24 MED ORDER — ALPRAZOLAM 0.5 MG PO TABS: 0.2500 mg | ORAL_TABLET | Freq: Two times a day (BID) | ORAL | 1 refills | Status: DC | PRN

## 2022-01-24 NOTE — Telephone Encounter (Signed)
Pended.

## 2022-01-24 NOTE — Telephone Encounter (Signed)
Pt lm that she needs a refill request on her xanax. [id number] [place], [place] - [address]  [address], [place] [address]  Phone:  [phone]  Fax:  [phone]  DEA #:  [id number]  DAW Reason: --

## 2022-02-16 ENCOUNTER — Other Ambulatory Visit: Payer: Self-pay | Admitting: Physician Assistant

## 2022-05-12 ENCOUNTER — Other Ambulatory Visit: Payer: Self-pay

## 2022-05-12 ENCOUNTER — Other Ambulatory Visit: Payer: Self-pay | Admitting: Orthopedic Surgery

## 2022-05-12 DIAGNOSIS — M545 Low back pain, unspecified: Secondary | ICD-10-CM

## 2022-05-28 ENCOUNTER — Other Ambulatory Visit: Payer: Self-pay | Admitting: Physician Assistant

## 2022-05-30 ENCOUNTER — Other Ambulatory Visit: Payer: Managed Care, Other (non HMO)

## 2022-05-30 ENCOUNTER — Inpatient Hospital Stay: Admission: RE | Admit: 2022-05-30 | Payer: Managed Care, Other (non HMO) | Source: Ambulatory Visit

## 2022-06-08 ENCOUNTER — Other Ambulatory Visit: Payer: Managed Care, Other (non HMO)

## 2022-06-29 ENCOUNTER — Inpatient Hospital Stay: Admission: RE | Admit: 2022-06-29 | Payer: Managed Care, Other (non HMO) | Source: Ambulatory Visit

## 2022-06-29 ENCOUNTER — Ambulatory Visit (INDEPENDENT_AMBULATORY_CARE_PROVIDER_SITE_OTHER): Payer: 59 | Admitting: Physician Assistant

## 2022-06-29 ENCOUNTER — Encounter: Payer: Self-pay | Admitting: Physician Assistant

## 2022-06-29 DIAGNOSIS — F4310 Post-traumatic stress disorder, unspecified: Secondary | ICD-10-CM | POA: Diagnosis not present

## 2022-06-29 DIAGNOSIS — F900 Attention-deficit hyperactivity disorder, predominantly inattentive type: Secondary | ICD-10-CM

## 2022-06-29 DIAGNOSIS — F319 Bipolar disorder, unspecified: Secondary | ICD-10-CM

## 2022-06-29 DIAGNOSIS — F411 Generalized anxiety disorder: Secondary | ICD-10-CM | POA: Diagnosis not present

## 2022-06-29 MED ORDER — AMPHETAMINE-DEXTROAMPHETAMINE 30 MG PO TABS: 30.0000 mg | ORAL_TABLET | Freq: Two times a day (BID) | ORAL | 0 refills | Status: DC

## 2022-06-29 MED ORDER — LAMOTRIGINE 100 MG PO TABS: 100.0000 mg | ORAL_TABLET | Freq: Every day | ORAL | 1 refills | Status: DC

## 2022-06-29 MED ORDER — ALPRAZOLAM 0.5 MG PO TABS: 0.2500 mg | ORAL_TABLET | Freq: Two times a day (BID) | ORAL | 1 refills | Status: DC | PRN

## 2022-06-29 MED ORDER — AMPHETAMINE-DEXTROAMPHETAMINE 30 MG PO TABS
30.0000 mg | ORAL_TABLET | Freq: Two times a day (BID) | ORAL | 0 refills | Status: DC
Start: 2022-08-28 — End: 2023-01-02

## 2022-06-29 MED ORDER — GABAPENTIN 600 MG PO TABS: 600.0000 mg | ORAL_TABLET | Freq: Two times a day (BID) | ORAL | 1 refills | Status: DC

## 2022-06-29 MED ORDER — DIVALPROEX SODIUM ER 500 MG PO TB24: ORAL_TABLET | ORAL | 1 refills | Status: DC

## 2022-06-29 NOTE — Progress Notes (Signed)
[facility]  Patient ID: Kelli Pennington,  MRN: [id number]  PCP: Darrow Bussing, MD  Date of Evaluation: 06/29/2022 Time spent:30 minutes  Chief Complaint:  Chief Complaint   Anxiety; ADHD; Depression; Follow-up     HISTORY/CURRENT STATUS: HPI For routine med check.  She is doing really well.  She got a new job since our last visit, it is going well and she is making more money, is very happy.  She is able to enjoy things.  Energy and motivation are good.  Not isolating.  Sleeps well most of the time.  ADLs and personal hygiene are normal.  Appetite is normal but she has purposely lost a little weight lately.  Does not cry easily.  No suicidal or homicidal thoughts.  Adderall still works very well.  States that attention is good without easy distractibility.  Able to focus on things and finish tasks to completion.   She does have anxiety at times, mostly situational.  She occasionally takes Xanax which is effective.  Patient denies increased energy with decreased need for sleep, no increased talkativeness, no racing thoughts, no impulsivity or risky behaviors, no increased spending, no increased libido, no grandiosity, no increased irritability or anger, no paranoia, and no hallucinations.  Denies dizziness, syncope, seizures, numbness, tingling, tremor, tics, unsteady gait, slurred speech, confusion. Denies muscle or joint pain, stiffness, or dystonia.  Individual Medical History/ Review of Systems: Changes? :No   had Botox a few weeks ago, caused bruising around her cheeks.  Denies abuse or falls.  Past medications for mental health diagnoses include: Depakote, Celexa, Wellbutrin, Klonopin, Prozac, Zoloft, Lexapro, Adderall  Allergies: Fish-derived products and Peanut-containing drug products  Current Medications:  Current Outpatient Medications:    albuterol (PROVENTIL HFA;VENTOLIN HFA) 108 (90 Base) MCG/ACT inhaler, Inhale 2 puffs into the lungs every 4 (four)  hours as needed for wheezing or shortness of breath., Disp: 18 g, Rfl: 1   buPROPion (WELLBUTRIN XL) 300 MG 24 hr tablet, TAKE ONE TABLET BY MOUTH ONE TIME DAILY, Disp: 90 tablet, Rfl: 0   gabapentin (NEURONTIN) 300 MG capsule, Take 2 capsules (600 mg total) by mouth at bedtime. (Patient taking differently: Take 600 mg by mouth 2 (two) times daily.), Disp: 180 capsule, Rfl: 1   gabapentin (NEURONTIN) 600 MG tablet, Take 1 tablet (600 mg total) by mouth 2 (two) times daily., Disp: 180 tablet, Rfl: 1   levothyroxine (SYNTHROID, LEVOTHROID) 88 MCG tablet, Take 88 mcg by mouth daily before breakfast., Disp: , Rfl: 2   valACYclovir (VALTREX) 1000 MG tablet, Take 1,000 mg by mouth daily as needed. , Disp: , Rfl:    ALPRAZolam (XANAX) 0.5 MG tablet, Take 0.5-1 tablets (0.25-0.5 mg total) by mouth 2 (two) times daily as needed for anxiety., Disp: 30 tablet, Rfl: 1   [START ON 08/28/2022] amphetamine-dextroamphetamine (ADDERALL) 30 MG tablet, Take 1 tablet by mouth 2 (two) times daily., Disp: 60 tablet, Rfl: 0   [START ON 07/29/2022] amphetamine-dextroamphetamine (ADDERALL) 30 MG tablet, Take 1 tablet by mouth 2 (two) times daily., Disp: 60 tablet, Rfl: 0   amphetamine-dextroamphetamine (ADDERALL) 30 MG tablet, Take 1 tablet by mouth 2 (two) times daily., Disp: 60 tablet, Rfl: 0   budesonide-formoterol (SYMBICORT) 80-4.5 MCG/ACT inhaler, Inhale 2 puffs into the lungs 2 (two) times daily. USE SPACER. (Patient not taking: Reported on 10/21/2020), Disp: 1 Inhaler, Rfl: 5   divalproex (DEPAKOTE ER) 500 MG 24 hr tablet, TAKE 2 TABLETS BY MOUTH EVERY DAY AT BEDTIME, Disp: 180 tablet, Rfl:  1   fluticasone (FLONASE) 50 MCG/ACT nasal spray, Two sprays each nostril once a day as needed for nasal congestion or drainage. (Patient not taking: Reported on 10/21/2020), Disp: 16 g, Rfl: 5   lamoTRIgine (LAMICTAL) 100 MG tablet, Take 1 tablet (100 mg total) by mouth daily., Disp: 90 tablet, Rfl: 1 Medication Side Effects: sexual  dysfunction  Family Medical/ Social History: Changes?  she and husband are trying to work things out, she has moved back in their home.  MENTAL HEALTH EXAM:  Last menstrual period 12/14/2018.There is no height or weight on file to calculate BMI.  General Appearance: Casual, Well Groomed, and yellowish bruise on right zygomatic process  Eye Contact:  Good  Speech:  Clear and Coherent and Normal Rate  Volume:  Normal  Mood:  Euthymic  Affect:  Congruent  Thought Process:  Goal Directed and Descriptions of Associations: Circumstantial  Orientation:  Full (Time, Place, and Person)  Thought Content: Logical   Suicidal Thoughts:  No  Homicidal Thoughts:  No  Memory:  WNL  Judgement:  Good  Insight:  Good  Psychomotor Activity:  Normal  Concentration:  Concentration: Good and Attention Span: Good  Recall:  Good  Fund of Knowledge: Good  Language: Good  Assets:  Desire for Improvement  ADL's:  Intact  Cognition: WNL  Prognosis:  Good   Labs 02/17/2022 CBC with differential normal.  Platelets 172 CMP completely normal, specifically LFTs. TSH 5.14 Lipids total cholesterol 204, triglycerides 92, HDL 85, LDL 100  09/03/2021 Depakote level 66.6  DIAGNOSES:    ICD-10-CM   1. Bipolar I disorder (HCC)  F31.9     2. PTSD (post-traumatic stress disorder)  F43.10     3. Attention deficit hyperactivity disorder (ADHD), predominantly inattentive type  F90.0     4. Generalized anxiety disorder  F41.1       Receiving Psychotherapy: No   RECOMMENDATIONS:  PDMP was reviewed. Last Xanax filled 01/24/2022.  Adderall filled 05/28/2022. I provided 30 minutes of face to face time during this encounter, including time spent before and after the visit in records review, medical decision making, counseling pertinent to today's visit, and charting.   I am glad to see her doing so well!  No changes in medications are needed.  Continue Xanax 0.5 mg, 1/2-1 p.o. twice daily as needed.  Use  sparingly. Continue Adderall 30 mg, 1 p.o. every morning and 1 p.o. q. afternoon. Continue Wellbutrin XL 300 mg, 1 p.o. every morning. Continue Depakote ER 500 mg, 2 p.o. nightly. Continue gabapentin 300 mg, 2 p.o. nightly. Continue Lamictal 100 mg, 1 p.o. daily. Return in 6 months.  Melony Overly, PA-C

## 2022-06-30 ENCOUNTER — Ambulatory Visit
Admission: RE | Admit: 2022-06-30 | Discharge: 2022-06-30 | Disposition: A | Discharge status: Home / Self Care | Payer: Managed Care, Other (non HMO) | Source: Ambulatory Visit | Attending: Orthopedic Surgery | Admitting: Orthopedic Surgery

## 2022-06-30 ENCOUNTER — Other Ambulatory Visit: Payer: Self-pay | Admitting: Orthopedic Surgery

## 2022-06-30 DIAGNOSIS — M545 Low back pain, unspecified: Secondary | ICD-10-CM

## 2022-06-30 MED ORDER — FENTANYL CITRATE PF 50 MCG/ML IJ SOSY
25.0000 ug | PREFILLED_SYRINGE | INTRAMUSCULAR | Status: DC | PRN
  Administered 2022-06-30 (×3): 25 ug via INTRAVENOUS

## 2022-06-30 MED ORDER — CEFAZOLIN SODIUM-DEXTROSE 2-4 GM/100ML-% IV SOLN
2.0000 g | INTRAVENOUS | Status: AC
  Administered 2022-06-30: 2 g via INTRAVENOUS

## 2022-06-30 MED ORDER — MIDAZOLAM HCL 2 MG/2ML IJ SOLN
1.0000 mg | INTRAMUSCULAR | Status: DC | PRN
  Administered 2022-06-30 (×3): 0.5 mg via INTRAVENOUS

## 2022-06-30 MED ORDER — SODIUM CHLORIDE 0.9 % IV SOLN: INTRAVENOUS | Status: DC

## 2022-06-30 MED ORDER — IOPAMIDOL (ISOVUE-M 200) INJECTION 41%
8.5000 mL | Freq: Once | INTRAMUSCULAR | Status: AC
  Administered 2022-06-30: 8.5 mL

## 2022-06-30 MED ORDER — KETOROLAC TROMETHAMINE 30 MG/ML IJ SOLN
30.0000 mg | Freq: Once | INTRAMUSCULAR | Status: AC
  Administered 2022-06-30: 30 mg via INTRAVENOUS

## 2022-06-30 NOTE — Progress Notes (Signed)
Pt back in nursing recovery area. Pt alert and awake, oriented x3. Pt follows commands, talks in complete sentences and has no complaints at this time. Pt will remain in nurses station until discharged by Radiologist.

## 2022-06-30 NOTE — Discharge Instructions (Signed)
     Discogram Post Procedure Discharge Instructions  May resume a regular diet and any medications that you routinely take (including pain medications). No driving day of procedure. Upon discharge go home and rest for at least 4 hours.  May use an ice pack as needed to injection sites on back.  Ice to back 30 minutes on and 30 minutes off, all day. May remove bandades later, today. It is not unusual to be sore for several days after this procedure.    Please contact our office at [phone] for the following symptoms:  Fever greater than 100 degrees Increased swelling, pain, or redness at injection site.   Thank you for visiting [facility].  

## 2022-06-30 NOTE — Discharge Instr - Other Orders (Addendum)
1055: pt in nursing recovery area post procedure. Pt reports pain 9/10 in lower back from discogram. See MAR.  1110: pt reports relief in pain from toradol. Ranking pain 7/10.

## 2022-07-22 ENCOUNTER — Encounter: Payer: Self-pay | Admitting: Physician Assistant

## 2022-07-22 NOTE — Progress Notes (Signed)
Please let her know CBC is completely normal, CMP all normal, total cholesterol is 212, LDL 107, HDL 82 and triglycerides 115.  TSH is 5.1 which is the upper limit of normal.  Hemoglobin A1c is 5.2 which is great.  Depakote level is 85 which is in a good range.  Continue same meds and doses.  Thank you

## 2022-07-22 NOTE — Progress Notes (Signed)
Sent message via mychart

## 2022-09-05 ENCOUNTER — Telehealth: Payer: Self-pay | Admitting: Physician Assistant

## 2022-09-05 MED ORDER — BUPROPION HCL ER (XL) 300 MG PO TB24: 300.0000 mg | ORAL_TABLET | Freq: Every day | ORAL | 0 refills | Status: DC

## 2022-09-05 NOTE — Telephone Encounter (Signed)
Pt called and needs a refill on her wellbutrin xl 300 mg. She would like it top go to a different pharamacy. [facility] [place] [address].

## 2022-09-05 NOTE — Telephone Encounter (Signed)
Rx sent 

## 2022-10-10 ENCOUNTER — Ambulatory Visit (HOSPITAL_COMMUNITY): Payer: Self-pay | Admitting: Orthopedic Surgery

## 2022-10-14 NOTE — Progress Notes (Signed)
Surgical Instructions    Your procedure is scheduled on Monday November 6.  Report to [facility] Main Entrance "A" at 5:30 A.M., then check in with the Admitting office.  Call this number if you have problems the morning of surgery:  [phone]   If you have any questions prior to your surgery date call [phone]: Open Monday-Friday 8am-4pm If you experience any cold or flu symptoms such as cough, fever, chills, shortness of breath, etc. between now and your scheduled surgery, please notify us at the above number     Remember:  Do not eat or drink after midnight the night before your surgery   Take these medicines the morning of surgery with A SIP OF WATER:  budesonide-formoterol (SYMBICORT) 80-4.5 MCG/ACT inhaler  buPROPion (WELLBUTRIN XL)  gabapentin (NEURONTIN)  lamoTRIgine (LAMICTAL)  levothyroxine (SYNTHROID)  ALPRAZolam (XANAX)  if needed albuterol (PROVENTIL HFA;VENTOLIN HFA) 108 (90 Base) MCG/ACT inhaler if needed fluticasone (FLONASE) 50 MCG/ACT nasal spray  if needed valACYclovir (VALTREX)  if needed   As of today, STOP taking any Aspirin (unless otherwise instructed by your surgeon) Aleve, Naproxen, Ibuprofen, Motrin, Advil, Goody's, BC's, all herbal medications, fish oil, and all vitamins.           [facility] is not responsible for any belongings or valuables.    Do NOT Smoke (Tobacco/Vaping)  24 hours prior to your procedure  If you use a CPAP at night, you may bring your mask for your overnight stay.   Contacts, glasses, hearing aids, dentures or partials may not be worn into surgery, please bring cases for these belongings   For patients admitted to the hospital, discharge time will be determined by your treatment team.   Patients discharged the day of surgery will not be allowed to drive home, and someone needs to stay with them for 24 hours.   SURGICAL WAITING ROOM VISITATION Patients having surgery or a procedure may have no more than 2 support  people in the waiting area - these visitors may rotate.   Children under the age of 3 must have an adult with them who is not the patient. If the patient needs to stay at the hospital during part of their recovery, the visitor guidelines for inpatient rooms apply. Pre-op nurse will coordinate an appropriate time for 1 support person to accompany patient in pre-op.  This support person may not rotate.   Please refer to [email or web address] for the visitor guidelines for Inpatients (after your surgery is over and you are in a regular room).    Special instructions:    Oral Hygiene is also important to reduce your risk of infection.  Remember - BRUSH YOUR TEETH THE MORNING OF SURGERY WITH YOUR REGULAR TOOTHPASTE   [facility]- Preparing For Surgery  Before surgery, you can play an important role. Because skin is not sterile, your skin needs to be as free of germs as possible. You can reduce the number of germs on your skin by washing with CHG (chlorahexidine gluconate) Soap before surgery.  CHG is an antiseptic cleaner which kills germs and bonds with the skin to continue killing germs even after washing.     Please do not use if you have an allergy to CHG or antibacterial soaps. If your skin becomes reddened/irritated stop using the CHG.  Do not shave (including legs and underarms) for at least 48 hours prior to first CHG shower. It is OK to shave your face.  Please follow these instructions carefully.  Shower the NIGHT BEFORE SURGERY and the MORNING OF SURGERY with CHG Soap.   If you chose to wash your hair, wash your hair first as usual with your normal shampoo. After you shampoo, rinse your hair and body thoroughly to remove the shampoo.  Then [employer] and genitals (private parts) with your normal soap and rinse thoroughly to remove soap.  After that Use CHG Soap as you would any other liquid soap. You can apply CHG directly to the  skin and wash gently with a scrungie or a clean washcloth.   Apply the CHG Soap to your body ONLY FROM THE NECK DOWN.  Do not use on open wounds or open sores. Avoid contact with your eyes, ears, mouth and genitals (private parts). Wash Face and genitals (private parts)  with your normal soap.   Wash thoroughly, paying special attention to the area where your surgery will be performed.  Thoroughly rinse your body with warm water from the neck down.  DO NOT shower/wash with your normal soap after using and rinsing off the CHG Soap.  Pat yourself dry with a CLEAN TOWEL.  Wear CLEAN PAJAMAS to bed the night before surgery  Place CLEAN SHEETS on your bed the night before your surgery  DO NOT SLEEP WITH PETS.   Day of Surgery:  Take a shower with CHG soap. Wear Clean/Comfortable clothing the morning of surgery. Do not wear jewelry or makeup. Do not wear lotions, powders, perfumes/cologne or deodorant. Do not shave 48 hours prior to surgery.  Men may shave face and neck. Do not bring valuables to the hospital. Do not wear nail polish, gel polish, artificial nails, or any other type of covering on natural nails (fingers and toes) If you have artificial nails or gel coating that need to be removed by a nail salon, please have this removed prior to surgery. Artificial nails or gel coating may interfere with anesthesia's ability to adequately monitor your vital signs. Remember to brush your teeth WITH YOUR REGULAR TOOTHPASTE.    If you received a COVID test during your pre-op visit, it is requested that you wear a mask when out in public, stay away from anyone that may not be feeling well, and notify your surgeon if you develop symptoms. If you have been in contact with anyone that has tested positive in the last 10 days, please notify your surgeon.    Please read over the following fact sheets that you were given.

## 2022-10-17 ENCOUNTER — Encounter (HOSPITAL_COMMUNITY): Payer: Self-pay

## 2022-10-17 ENCOUNTER — Other Ambulatory Visit: Payer: Self-pay

## 2022-10-17 ENCOUNTER — Encounter (HOSPITAL_COMMUNITY)
Admission: RE | Admit: 2022-10-17 | Discharge: 2022-10-17 | Disposition: A | Discharge status: Home / Self Care | Payer: 59 | Source: Ambulatory Visit | Attending: Orthopedic Surgery | Admitting: Orthopedic Surgery

## 2022-10-17 VITALS — BP 119/79 | HR 86 | Temp 97.8°F | Resp 17 | Ht 66.0 in | Wt 137.8 lb

## 2022-10-17 DIAGNOSIS — G988 Other disorders of nervous system: Secondary | ICD-10-CM | POA: Diagnosis not present

## 2022-10-17 DIAGNOSIS — Z01818 Encounter for other preprocedural examination: Secondary | ICD-10-CM | POA: Diagnosis present

## 2022-10-17 HISTORY — DX: Anxiety disorder, unspecified: F41.9

## 2022-10-17 HISTORY — DX: Attention-deficit hyperactivity disorder, unspecified type: F90.9

## 2022-10-17 HISTORY — DX: Other complications of anesthesia, initial encounter: T88.59XA

## 2022-10-17 HISTORY — DX: Sleep apnea, unspecified: G47.30

## 2022-10-17 LAB — SURGICAL PCR SCREEN
MRSA, PCR: NEGATIVE
Staphylococcus aureus: NEGATIVE

## 2022-10-17 LAB — CBC
HCT: 38.1 % (ref 36.0–46.0)
Hemoglobin: 12.5 g/dL (ref 12.0–15.0)
MCH: 32 pg (ref 26.0–34.0)
MCHC: 32.8 g/dL (ref 30.0–36.0)
MCV: 97.4 fL (ref 80.0–100.0)
Platelets: 228 10*3/uL (ref 150–400)
RBC: 3.91 MIL/uL (ref 3.87–5.11)
RDW: 12 % (ref 11.5–15.5)
WBC: 5.1 10*3/uL (ref 4.0–10.5)
nRBC: 0 % (ref 0.0–0.2)

## 2022-10-17 LAB — BASIC METABOLIC PANEL
Anion gap: 4 — ABNORMAL LOW (ref 5–15)
BUN: 11 mg/dL (ref 6–20)
CO2: 30 mmol/L (ref 22–32)
Calcium: 9 mg/dL (ref 8.9–10.3)
Chloride: 106 mmol/L (ref 98–111)
Creatinine, Ser: 0.81 mg/dL (ref 0.44–1.00)
GFR, Estimated: >60 mL/min (ref >60)
Glucose, Bld: 83 mg/dL (ref 70–99)
Potassium: 3.7 mmol/L (ref 3.5–5.1)
Sodium: 140 mmol/L (ref 135–145)

## 2022-10-17 LAB — TYPE AND SCREEN
ABO/RH(D): B POS
Antibody Screen: NEGATIVE

## 2022-10-17 NOTE — Progress Notes (Signed)
PCP - Deon Pilling- last office note requested from PCP Cardiologist - denies  PPM/ICD - denies   Chest x-ray - N/A EKG - EKG done in August at PCP- requested tracing from PCP- pt reports it was normal , but no reading found in epic/care everywhere Stress Test - denies ECHO - denies Cardiac Cath - denies  Sleep Study - pt reports she had sleep apnea "years ago" prior to having weight loss surgery and losing weight. Pt reports she does not use a CPAP   Fasting Blood Sugar - N/A  Blood Thinner Instructions: N/A Aspirin Instructions:N/A  ERAS Protcol - NPO order   COVID TEST- N/A   Anesthesia review: follow up on requested records.   Patient denies shortness of breath, fever, cough and chest pain at PAT appointment   All instructions explained to the patient, with a verbal understanding of the material. Patient agrees to go over the instructions while at home for a better understanding. Patient also instructed to self quarantine after being tested for COVID-19. The opportunity to ask questions was provided.

## 2022-10-21 MED ORDER — TRANEXAMIC ACID 1000 MG/10ML IV SOLN
2000.0000 mg | INTRAVENOUS | Status: DC
  Filled 2022-10-21: qty 20

## 2022-10-23 NOTE — Anesthesia Preprocedure Evaluation (Signed)
Anesthesia Evaluation  Patient identified by MRN, date of birth, ID band Patient awake    Reviewed: Allergy & Precautions, NPO status , Patient's Chart, lab work & pertinent test results  Airway Mallampati: II  TM Distance: >3 FB Neck ROM: Full    Dental  (+) Partial Upper   Pulmonary asthma , sleep apnea    Pulmonary exam normal        Cardiovascular negative cardio ROS  Rhythm:Regular Rate:Normal     Neuro/Psych   Anxiety Depression Bipolar Disorder   negative neurological ROS     GI/Hepatic Neg liver ROS, hiatal hernia,,,  Endo/Other  Hypothyroidism    Renal/GU negative Renal ROS  negative genitourinary   Musculoskeletal  (+) Arthritis , Osteoarthritis,  Fibromyalgia -  Abdominal Normal abdominal exam  (+)   Peds  Hematology negative hematology ROS (+)   Anesthesia Other Findings   Reproductive/Obstetrics                             Anesthesia Physical Anesthesia Plan  ASA: 3  Anesthesia Plan: General   Post-op Pain Management: Celebrex PO (pre-op)*, Tylenol PO (pre-op)* and Ketamine IV*   Induction: Intravenous  PONV Risk Score and Plan: 3 and Ondansetron, Dexamethasone, Midazolam and Treatment may vary due to age or medical condition  Airway Management Planned: Mask and Oral ETT  Additional Equipment: None  Intra-op Plan:   Post-operative Plan: Extubation in OR  Informed Consent: I have reviewed the patients History and Physical, chart, labs and discussed the procedure including the risks, benefits and alternatives for the proposed anesthesia with the patient or authorized representative who has indicated his/her understanding and acceptance.     Dental advisory given  Plan Discussed with: CRNA  Anesthesia Plan Comments: (Lab Results      Component                Value               Date                      WBC                      5.1                 10/17/2022                 HGB                      12.5                10/17/2022                HCT                      38.1                10/17/2022                MCV                      97.4                10/17/2022                PLT  228                 10/17/2022            Lab Results      Component                Value               Date                      NA                       140                 10/17/2022                K                        3.7                 10/17/2022                CO2                      30                  10/17/2022                GLUCOSE                  83                  10/17/2022                BUN                      11                  10/17/2022                CREATININE               0.81                10/17/2022                CALCIUM                  9.0                 10/17/2022                GFRNONAA                 >60                 10/17/2022           )       Anesthesia Quick Evaluation

## 2022-10-24 ENCOUNTER — Inpatient Hospital Stay (HOSPITAL_COMMUNITY): Payer: 59 | Admitting: Physician Assistant

## 2022-10-24 ENCOUNTER — Inpatient Hospital Stay (HOSPITAL_COMMUNITY): Payer: 59

## 2022-10-24 ENCOUNTER — Other Ambulatory Visit: Payer: Self-pay

## 2022-10-24 ENCOUNTER — Encounter (HOSPITAL_COMMUNITY)
Admission: RE | Disposition: A | Discharge status: Home / Self Care | Payer: Self-pay | Source: Home / Self Care | Attending: Orthopedic Surgery

## 2022-10-24 ENCOUNTER — Inpatient Hospital Stay (HOSPITAL_COMMUNITY)
Admission: RE | Admit: 2022-10-24 | Discharge: 2022-10-25 | DRG: 454 | Disposition: A | Discharge status: Home / Self Care | Payer: 59 | Attending: Orthopedic Surgery | Admitting: Orthopedic Surgery

## 2022-10-24 ENCOUNTER — Inpatient Hospital Stay (HOSPITAL_COMMUNITY): Payer: 59 | Admitting: Anesthesiology

## 2022-10-24 ENCOUNTER — Encounter (HOSPITAL_COMMUNITY): Payer: Self-pay | Admitting: Orthopedic Surgery

## 2022-10-24 DIAGNOSIS — Z981 Arthrodesis status: Principal | ICD-10-CM

## 2022-10-24 DIAGNOSIS — M5116 Intervertebral disc disorders with radiculopathy, lumbar region: Secondary | ICD-10-CM

## 2022-10-24 DIAGNOSIS — F3181 Bipolar II disorder: Secondary | ICD-10-CM | POA: Diagnosis present

## 2022-10-24 DIAGNOSIS — Z79899 Other long term (current) drug therapy: Secondary | ICD-10-CM | POA: Diagnosis not present

## 2022-10-24 DIAGNOSIS — Z79890 Hormone replacement therapy (postmenopausal): Secondary | ICD-10-CM

## 2022-10-24 DIAGNOSIS — F909 Attention-deficit hyperactivity disorder, unspecified type: Secondary | ICD-10-CM | POA: Diagnosis present

## 2022-10-24 DIAGNOSIS — Z7951 Long term (current) use of inhaled steroids: Secondary | ICD-10-CM | POA: Diagnosis not present

## 2022-10-24 DIAGNOSIS — F419 Anxiety disorder, unspecified: Secondary | ICD-10-CM | POA: Diagnosis present

## 2022-10-24 DIAGNOSIS — M4316 Spondylolisthesis, lumbar region: Secondary | ICD-10-CM | POA: Diagnosis present

## 2022-10-24 DIAGNOSIS — G4730 Sleep apnea, unspecified: Secondary | ICD-10-CM | POA: Diagnosis not present

## 2022-10-24 DIAGNOSIS — M5416 Radiculopathy, lumbar region: Secondary | ICD-10-CM | POA: Diagnosis present

## 2022-10-24 DIAGNOSIS — Q7649 Other congenital malformations of spine, not associated with scoliosis: Secondary | ICD-10-CM | POA: Diagnosis not present

## 2022-10-24 DIAGNOSIS — Z885 Allergy status to narcotic agent status: Secondary | ICD-10-CM | POA: Diagnosis not present

## 2022-10-24 DIAGNOSIS — J45909 Unspecified asthma, uncomplicated: Secondary | ICD-10-CM | POA: Diagnosis present

## 2022-10-24 DIAGNOSIS — E039 Hypothyroidism, unspecified: Secondary | ICD-10-CM | POA: Diagnosis present

## 2022-10-24 HISTORY — PX: TRANSFORAMINAL LUMBAR INTERBODY FUSION (TLIF) WITH PEDICLE SCREW FIXATION 2 LEVEL: SHX6142

## 2022-10-24 LAB — ABO/RH: ABO/RH(D): B POS

## 2022-10-24 SURGERY — TRANSFORAMINAL LUMBAR INTERBODY FUSION (TLIF) WITH PEDICLE SCREW FIXATION 2 LEVEL
Anesthesia: General | Site: Spine Lumbar

## 2022-10-24 MED ORDER — STERILE WATER FOR IRRIGATION IR SOLN: Status: DC | PRN

## 2022-10-24 MED ORDER — CHLORHEXIDINE GLUCONATE 0.12 % MT SOLN
15.0000 mL | Freq: Once | OROMUCOSAL | Status: AC
  Administered 2022-10-24: 15 mL via OROMUCOSAL
  Filled 2022-10-24: qty 15

## 2022-10-24 MED ORDER — METHOCARBAMOL 500 MG PO TABS: 500.0000 mg | ORAL_TABLET | Freq: Three times a day (TID) | ORAL | 0 refills | Status: AC | PRN

## 2022-10-24 MED ORDER — PHENOL 1.4 % MT LIQD: 1.0000 | OROMUCOSAL | Status: DC | PRN

## 2022-10-24 MED ORDER — LINACLOTIDE 145 MCG PO CAPS
145.0000 ug | ORAL_CAPSULE | Freq: Every day | ORAL | Status: DC
  Administered 2022-10-25: 145 ug via ORAL
  Filled 2022-10-24: qty 1

## 2022-10-24 MED ORDER — GLYCOPYRROLATE PF 0.2 MG/ML IJ SOSY
PREFILLED_SYRINGE | INTRAMUSCULAR | Status: DC | PRN
  Administered 2022-10-24: .2 mg via INTRAVENOUS

## 2022-10-24 MED ORDER — ONDANSETRON HCL 4 MG/2ML IJ SOLN
INTRAMUSCULAR | Status: DC | PRN
  Administered 2022-10-24: 4 mg via INTRAVENOUS

## 2022-10-24 MED ORDER — CEFAZOLIN SODIUM 1 G IJ SOLR
INTRAMUSCULAR | Status: AC
  Filled 2022-10-24: qty 20

## 2022-10-24 MED ORDER — ACETAMINOPHEN 325 MG PO TABS: 650.0000 mg | ORAL_TABLET | ORAL | Status: DC | PRN

## 2022-10-24 MED ORDER — ALBUTEROL SULFATE (2.5 MG/3ML) 0.083% IN NEBU: 3.0000 mL | INHALATION_SOLUTION | RESPIRATORY_TRACT | Status: DC | PRN

## 2022-10-24 MED ORDER — SODIUM CHLORIDE 0.9% FLUSH
3.0000 mL | Freq: Two times a day (BID) | INTRAVENOUS | Status: DC
  Administered 2022-10-25: 3 mL via INTRAVENOUS

## 2022-10-24 MED ORDER — SUCCINYLCHOLINE CHLORIDE 200 MG/10ML IV SOSY
PREFILLED_SYRINGE | INTRAVENOUS | Status: AC
  Filled 2022-10-24: qty 10

## 2022-10-24 MED ORDER — TRANEXAMIC ACID 1000 MG/10ML IV SOLN
INTRAVENOUS | Status: DC | PRN
  Administered 2022-10-24: 2000 mg via TOPICAL

## 2022-10-24 MED ORDER — LEVOTHYROXINE SODIUM 75 MCG PO TABS
75.0000 ug | ORAL_TABLET | Freq: Every day | ORAL | Status: DC
  Administered 2022-10-25: 75 ug via ORAL
  Filled 2022-10-24: qty 1

## 2022-10-24 MED ORDER — DEXAMETHASONE SODIUM PHOSPHATE 10 MG/ML IJ SOLN
INTRAMUSCULAR | Status: AC
  Filled 2022-10-24: qty 1

## 2022-10-24 MED ORDER — METHOCARBAMOL 1000 MG/10ML IJ SOLN: 500.0000 mg | Freq: Four times a day (QID) | INTRAVENOUS | Status: DC | PRN

## 2022-10-24 MED ORDER — FENTANYL CITRATE (PF) 100 MCG/2ML IJ SOLN
25.0000 ug | INTRAMUSCULAR | Status: DC | PRN
  Administered 2022-10-24 (×2): 25 ug via INTRAVENOUS
  Administered 2022-10-24: 50 ug via INTRAVENOUS
  Administered 2022-10-24 (×2): 25 ug via INTRAVENOUS

## 2022-10-24 MED ORDER — BUPIVACAINE-EPINEPHRINE (PF) 0.25% -1:200000 IJ SOLN
INTRAMUSCULAR | Status: AC
  Filled 2022-10-24: qty 30

## 2022-10-24 MED ORDER — MIDAZOLAM HCL 2 MG/2ML IJ SOLN
INTRAMUSCULAR | Status: AC
  Filled 2022-10-24: qty 2

## 2022-10-24 MED ORDER — ORAL CARE MOUTH RINSE: 15.0000 mL | Freq: Once | OROMUCOSAL | Status: AC

## 2022-10-24 MED ORDER — METHOCARBAMOL 500 MG PO TABS
500.0000 mg | ORAL_TABLET | Freq: Four times a day (QID) | ORAL | Status: DC | PRN
  Administered 2022-10-24 – 2022-10-25 (×4): 500 mg via ORAL
  Filled 2022-10-24 (×5): qty 1

## 2022-10-24 MED ORDER — PROPOFOL 10 MG/ML IV BOLUS
INTRAVENOUS | Status: AC
  Filled 2022-10-24: qty 20

## 2022-10-24 MED ORDER — GABAPENTIN 600 MG PO TABS
600.0000 mg | ORAL_TABLET | Freq: Two times a day (BID) | ORAL | Status: DC
  Administered 2022-10-24 – 2022-10-25 (×2): 600 mg via ORAL
  Filled 2022-10-24 (×2): qty 1

## 2022-10-24 MED ORDER — HYDROMORPHONE HCL 1 MG/ML IJ SOLN
INTRAMUSCULAR | Status: AC
  Filled 2022-10-24: qty 0.5

## 2022-10-24 MED ORDER — CEFAZOLIN SODIUM-DEXTROSE 2-4 GM/100ML-% IV SOLN
2.0000 g | INTRAVENOUS | Status: AC
  Administered 2022-10-24 (×2): 2 g via INTRAVENOUS
  Filled 2022-10-24: qty 100

## 2022-10-24 MED ORDER — DEXMEDETOMIDINE HCL IN NACL 80 MCG/20ML IV SOLN
INTRAVENOUS | Status: AC
  Filled 2022-10-24: qty 20

## 2022-10-24 MED ORDER — SUCCINYLCHOLINE CHLORIDE 200 MG/10ML IV SOSY
PREFILLED_SYRINGE | INTRAVENOUS | Status: DC | PRN
  Administered 2022-10-24: 120 mg via INTRAVENOUS

## 2022-10-24 MED ORDER — 0.9 % SODIUM CHLORIDE (POUR BTL) OPTIME
TOPICAL | Status: DC | PRN
  Administered 2022-10-24 (×2): 1000 mL

## 2022-10-24 MED ORDER — PROPOFOL 500 MG/50ML IV EMUL
INTRAVENOUS | Status: DC | PRN
  Administered 2022-10-24: 95 ug/kg/min via INTRAVENOUS

## 2022-10-24 MED ORDER — MENTHOL 3 MG MT LOZG: 1.0000 | LOZENGE | OROMUCOSAL | Status: DC | PRN

## 2022-10-24 MED ORDER — SODIUM CHLORIDE 0.9% FLUSH: 3.0000 mL | INTRAVENOUS | Status: DC | PRN

## 2022-10-24 MED ORDER — HYDROMORPHONE HCL 1 MG/ML IJ SOLN
INTRAMUSCULAR | Status: DC | PRN
  Administered 2022-10-24: .5 mg via INTRAVENOUS

## 2022-10-24 MED ORDER — PROPOFOL 1000 MG/100ML IV EMUL
INTRAVENOUS | Status: AC
  Filled 2022-10-24: qty 100

## 2022-10-24 MED ORDER — LACTATED RINGERS IV SOLN: INTRAVENOUS | Status: DC

## 2022-10-24 MED ORDER — HYDROMORPHONE HCL 1 MG/ML IJ SOLN: 1.0000 mg | INTRAMUSCULAR | Status: AC | PRN

## 2022-10-24 MED ORDER — BUPIVACAINE-EPINEPHRINE 0.25% -1:200000 IJ SOLN
INTRAMUSCULAR | Status: DC | PRN
  Administered 2022-10-24: 10 mL

## 2022-10-24 MED ORDER — ALPRAZOLAM 0.25 MG PO TABS: 0.2500 mg | ORAL_TABLET | Freq: Two times a day (BID) | ORAL | Status: DC | PRN

## 2022-10-24 MED ORDER — THROMBIN 20000 UNITS EX SOLR
CUTANEOUS | Status: AC
  Filled 2022-10-24: qty 20000

## 2022-10-24 MED ORDER — ONDANSETRON HCL 4 MG PO TABS: 4.0000 mg | ORAL_TABLET | Freq: Four times a day (QID) | ORAL | Status: DC | PRN

## 2022-10-24 MED ORDER — DIVALPROEX SODIUM ER 500 MG PO TB24
1000.0000 mg | ORAL_TABLET | Freq: Every day | ORAL | Status: DC
  Administered 2022-10-24: 1000 mg via ORAL
  Filled 2022-10-24 (×2): qty 2

## 2022-10-24 MED ORDER — SODIUM CHLORIDE 0.9 % IV SOLN: INTRAVENOUS | Status: DC

## 2022-10-24 MED ORDER — AMISULPRIDE (ANTIEMETIC) 5 MG/2ML IV SOLN: 10.0000 mg | Freq: Once | INTRAVENOUS | Status: DC | PRN

## 2022-10-24 MED ORDER — FENTANYL CITRATE (PF) 100 MCG/2ML IJ SOLN
INTRAMUSCULAR | Status: AC
  Filled 2022-10-24: qty 2

## 2022-10-24 MED ORDER — DEXMEDETOMIDINE HCL IN NACL 80 MCG/20ML IV SOLN
INTRAVENOUS | Status: DC | PRN
  Administered 2022-10-24 (×2): 8 ug via BUCCAL
  Administered 2022-10-24: 4 ug via BUCCAL
  Administered 2022-10-24: 8 ug via BUCCAL

## 2022-10-24 MED ORDER — KETAMINE HCL 10 MG/ML IJ SOLN
INTRAMUSCULAR | Status: DC | PRN
  Administered 2022-10-24 (×2): 10 mg via INTRAVENOUS
  Administered 2022-10-24: 30 mg via INTRAVENOUS

## 2022-10-24 MED ORDER — ONDANSETRON HCL 4 MG/2ML IJ SOLN: 4.0000 mg | Freq: Four times a day (QID) | INTRAMUSCULAR | Status: DC | PRN

## 2022-10-24 MED ORDER — POLYETHYLENE GLYCOL 3350 17 G PO PACK
17.0000 g | PACK | Freq: Every day | ORAL | Status: DC | PRN
  Administered 2022-10-25: 17 g via ORAL
  Filled 2022-10-24: qty 1

## 2022-10-24 MED ORDER — ONDANSETRON HCL 4 MG PO TABS: 4.0000 mg | ORAL_TABLET | Freq: Three times a day (TID) | ORAL | 0 refills | Status: AC | PRN

## 2022-10-24 MED ORDER — CELECOXIB 200 MG PO CAPS
200.0000 mg | ORAL_CAPSULE | Freq: Once | ORAL | Status: AC
  Administered 2022-10-24: 200 mg via ORAL
  Filled 2022-10-24: qty 1

## 2022-10-24 MED ORDER — SURGIFLO WITH THROMBIN (HEMOSTATIC MATRIX KIT) OPTIME
TOPICAL | Status: DC | PRN
  Administered 2022-10-24: 1 via TOPICAL

## 2022-10-24 MED ORDER — CEFAZOLIN SODIUM-DEXTROSE 1-4 GM/50ML-% IV SOLN
1.0000 g | Freq: Three times a day (TID) | INTRAVENOUS | Status: AC
  Administered 2022-10-24 – 2022-10-25 (×2): 1 g via INTRAVENOUS
  Filled 2022-10-24 (×2): qty 50

## 2022-10-24 MED ORDER — LAMOTRIGINE 100 MG PO TABS
100.0000 mg | ORAL_TABLET | Freq: Every day | ORAL | Status: DC
  Administered 2022-10-24 – 2022-10-25 (×2): 100 mg via ORAL
  Filled 2022-10-24 (×3): qty 1

## 2022-10-24 MED ORDER — SODIUM CHLORIDE 0.9 % IV SOLN
0.1500 ug/kg/min | INTRAVENOUS | Status: DC
  Administered 2022-10-24: .1 ug/kg/min via INTRAVENOUS
  Filled 2022-10-24 (×2): qty 2000

## 2022-10-24 MED ORDER — KETAMINE HCL 50 MG/5ML IJ SOSY
PREFILLED_SYRINGE | INTRAMUSCULAR | Status: AC
  Filled 2022-10-24: qty 5

## 2022-10-24 MED ORDER — GLYCOPYRROLATE PF 0.2 MG/ML IJ SOSY
PREFILLED_SYRINGE | INTRAMUSCULAR | Status: AC
  Filled 2022-10-24: qty 1

## 2022-10-24 MED ORDER — OXYCODONE-ACETAMINOPHEN 10-325 MG PO TABS: 1.0000 | ORAL_TABLET | Freq: Four times a day (QID) | ORAL | 0 refills | Status: AC | PRN

## 2022-10-24 MED ORDER — ONDANSETRON HCL 4 MG/2ML IJ SOLN
INTRAMUSCULAR | Status: AC
  Filled 2022-10-24: qty 2

## 2022-10-24 MED ORDER — FENTANYL CITRATE (PF) 250 MCG/5ML IJ SOLN
INTRAMUSCULAR | Status: DC | PRN
  Administered 2022-10-24 (×2): 50 ug via INTRAVENOUS
  Administered 2022-10-24: 25 ug via INTRAVENOUS
  Administered 2022-10-24: 100 ug via INTRAVENOUS
  Administered 2022-10-24: 25 ug via INTRAVENOUS

## 2022-10-24 MED ORDER — PHENYLEPHRINE 80 MCG/ML (10ML) SYRINGE FOR IV PUSH (FOR BLOOD PRESSURE SUPPORT)
PREFILLED_SYRINGE | INTRAVENOUS | Status: AC
  Filled 2022-10-24: qty 10

## 2022-10-24 MED ORDER — ACETAMINOPHEN 500 MG PO TABS
1000.0000 mg | ORAL_TABLET | Freq: Once | ORAL | Status: AC
  Administered 2022-10-24: 1000 mg via ORAL
  Filled 2022-10-24: qty 2

## 2022-10-24 MED ORDER — LACTATED RINGERS IV SOLN: INTRAVENOUS | Status: DC | PRN

## 2022-10-24 MED ORDER — OXYCODONE HCL 5 MG PO TABS
5.0000 mg | ORAL_TABLET | ORAL | Status: DC | PRN
  Filled 2022-10-24: qty 1

## 2022-10-24 MED ORDER — PHENYLEPHRINE 80 MCG/ML (10ML) SYRINGE FOR IV PUSH (FOR BLOOD PRESSURE SUPPORT)
PREFILLED_SYRINGE | INTRAVENOUS | Status: DC | PRN
  Administered 2022-10-24 (×2): 80 ug via INTRAVENOUS

## 2022-10-24 MED ORDER — LIDOCAINE 2% (20 MG/ML) 5 ML SYRINGE
INTRAMUSCULAR | Status: AC
  Filled 2022-10-24: qty 5

## 2022-10-24 MED ORDER — KETOROLAC TROMETHAMINE 0.5 % OP SOLN
OPHTHALMIC | Status: AC
  Filled 2022-10-24: qty 5

## 2022-10-24 MED ORDER — LIDOCAINE 2% (20 MG/ML) 5 ML SYRINGE
INTRAMUSCULAR | Status: DC | PRN
  Administered 2022-10-24: 40 mg via INTRAVENOUS

## 2022-10-24 MED ORDER — PHENYLEPHRINE HCL-NACL 20-0.9 MG/250ML-% IV SOLN
INTRAVENOUS | Status: DC | PRN
  Administered 2022-10-24: 30 ug/min via INTRAVENOUS

## 2022-10-24 MED ORDER — AMPHETAMINE-DEXTROAMPHETAMINE 30 MG PO TABS: 30.0000 mg | ORAL_TABLET | Freq: Two times a day (BID) | ORAL | Status: DC

## 2022-10-24 MED ORDER — MAGNESIUM CITRATE PO SOLN: 1.0000 | Freq: Once | ORAL | Status: DC | PRN

## 2022-10-24 MED ORDER — GELATIN ABSORBABLE 100 EX MISC
CUTANEOUS | Status: DC | PRN
  Administered 2022-10-24: 1 via TOPICAL

## 2022-10-24 MED ORDER — OXYCODONE HCL 5 MG PO TABS
10.0000 mg | ORAL_TABLET | ORAL | Status: DC | PRN
  Administered 2022-10-24 – 2022-10-25 (×6): 10 mg via ORAL
  Filled 2022-10-24 (×6): qty 2

## 2022-10-24 MED ORDER — PROPOFOL 10 MG/ML IV BOLUS
INTRAVENOUS | Status: DC | PRN
  Administered 2022-10-24: 150 mg via INTRAVENOUS
  Administered 2022-10-24: 20 mg via INTRAVENOUS

## 2022-10-24 MED ORDER — DEXAMETHASONE SODIUM PHOSPHATE 10 MG/ML IJ SOLN
INTRAMUSCULAR | Status: DC | PRN
  Administered 2022-10-24: 4 mg via INTRAVENOUS

## 2022-10-24 MED ORDER — MIDAZOLAM HCL 2 MG/2ML IJ SOLN
INTRAMUSCULAR | Status: DC | PRN
  Administered 2022-10-24: 2 mg via INTRAVENOUS

## 2022-10-24 MED ORDER — ACETAMINOPHEN 650 MG RE SUPP: 650.0000 mg | RECTAL | Status: DC | PRN

## 2022-10-24 MED ORDER — BUPROPION HCL ER (XL) 300 MG PO TB24
300.0000 mg | ORAL_TABLET | Freq: Every day | ORAL | Status: DC
  Administered 2022-10-25: 300 mg via ORAL
  Filled 2022-10-24 (×3): qty 1

## 2022-10-24 MED ORDER — KETOROLAC TROMETHAMINE 0.5 % OP SOLN
1.0000 [drp] | Freq: Four times a day (QID) | OPHTHALMIC | Status: DC
  Administered 2022-10-24: 1 [drp] via OPHTHALMIC

## 2022-10-24 MED ORDER — FENTANYL CITRATE (PF) 250 MCG/5ML IJ SOLN
INTRAMUSCULAR | Status: AC
  Filled 2022-10-24: qty 5

## 2022-10-24 SURGICAL SUPPLY — 74 items
BAG COUNTER SPONGE SURGICOUNT (BAG) ×1 IMPLANT
BAG DECANTER FOR FLEXI CONT (MISCELLANEOUS) IMPLANT
BLADE BONE MILL FINE (MISCELLANEOUS) ×1
BLADE CLIPPER SURG (BLADE) IMPLANT
BLADE MILL BN FN STRL DISP (MISCELLANEOUS) IMPLANT
BUR EGG ELITE 4.0 (BURR) ×1 IMPLANT
BUR MATCHSTICK NEURO 3.0 LAGG (BURR) ×1 IMPLANT
CAGE MOD EX PL 7X9X24 17D (Cage) IMPLANT
CANISTER SUCT 3000ML PPV (MISCELLANEOUS) ×1 IMPLANT
CAP RELINE MOD TULIP RMM (Cap) IMPLANT
CLIP NEUROVISION LG (CLIP) IMPLANT
CLSR STERI-STRIP ANTIMIC 1/2X4 (GAUZE/BANDAGES/DRESSINGS) ×1 IMPLANT
CNTNR URN SCR LID CUP LEK RST (MISCELLANEOUS) IMPLANT
CONT SPEC 4OZ STRL OR WHT (MISCELLANEOUS) ×1
COVER SURGICAL LIGHT HANDLE (MISCELLANEOUS) ×1 IMPLANT
DRAIN CHANNEL 15F RND FF W/TCR (WOUND CARE) IMPLANT
DRAPE C-ARM 42X72 X-RAY (DRAPES) ×1 IMPLANT
DRAPE C-ARMOR (DRAPES) ×1 IMPLANT
DRAPE POUCH INSTRU U-SHP 10X18 (DRAPES) IMPLANT
DRAPE SURG 17X23 STRL (DRAPES) ×1 IMPLANT
DRAPE U-SHAPE 47X51 STRL (DRAPES) ×1 IMPLANT
DRSG OPSITE POSTOP 4X8 (GAUZE/BANDAGES/DRESSINGS) ×1 IMPLANT
DURAPREP 26ML APPLICATOR (WOUND CARE) ×1 IMPLANT
ELECT BLADE 6.5 EXT (BLADE) ×1 IMPLANT
ELECT PENCIL ROCKER SW 15FT (MISCELLANEOUS) ×1 IMPLANT
ELECT REM PT RETURN 9FT ADLT (ELECTROSURGICAL) ×1
ELECTRODE REM PT RTRN 9FT ADLT (ELECTROSURGICAL) ×1 IMPLANT
GEL DBM PROPEL 5ML (Putty) IMPLANT
GLOVE BIOGEL PI IND STRL 8.5 (GLOVE) ×1 IMPLANT
GLOVE SS BIOGEL STRL SZ 8.5 (GLOVE) ×1 IMPLANT
GOWN STRL REUS W/TWL 2XL LVL3 (GOWN DISPOSABLE) ×1 IMPLANT
KIT BASIN OR (CUSTOM PROCEDURE TRAY) ×1 IMPLANT
KIT BONE GRAFT DELIVERY NUVA (NEUROSURGERY SUPPLIES) IMPLANT
KIT POSITION SURG JACKSON T1 (MISCELLANEOUS) IMPLANT
KIT TURNOVER KIT B (KITS) ×1 IMPLANT
MODULE EMG NDL SSEP NVM5 (NEEDLE) IMPLANT
MODULE EMG NEEDLE SSEP NVM5 (NEEDLE) ×1 IMPLANT
MODULE NVM5 NEXT GEN EMG (NEEDLE) IMPLANT
NDL 22X1.5 STRL (OR ONLY) (MISCELLANEOUS) IMPLANT
NDL SPNL 18GX3.5 QUINCKE PK (NEEDLE) ×1 IMPLANT
NEEDLE 22X1.5 STRL (OR ONLY) (MISCELLANEOUS) IMPLANT
NEEDLE SPNL 18GX3.5 QUINCKE PK (NEEDLE) IMPLANT
NS IRRIG 1000ML POUR BTL (IV SOLUTION) ×1 IMPLANT
PACK LAMINECTOMY ORTHO (CUSTOM PROCEDURE TRAY) ×1 IMPLANT
PACK UNIVERSAL I (CUSTOM PROCEDURE TRAY) ×1 IMPLANT
PAD ARMBOARD 7.5X6 YLW CONV (MISCELLANEOUS) ×2 IMPLANT
PATTIES SURGICAL .5 X.5 (GAUZE/BANDAGES/DRESSINGS) IMPLANT
PATTIES SURGICAL .5 X1 (DISPOSABLE) ×1 IMPLANT
POSITIONER HEAD PRONE TRACH (MISCELLANEOUS) ×1 IMPLANT
PROBE BALL TIP NVM5 SNG USE (BALLOONS) IMPLANT
ROD LORD RELINE O 5X80 (Rod) IMPLANT
ROD RELINE COCR 5.0X60MM (Rod) IMPLANT
SCREW LOCK RSS 4.5/5.0MM (Screw) IMPLANT
SCREW SHANK RELINE MOD 6.5X35 (Screw) IMPLANT
SHANK RELINE MOD 5.5X40 (Screw) IMPLANT
SPONGE SURGIFOAM ABS GEL 100 (HEMOSTASIS) ×1 IMPLANT
SPONGE T-LAP 4X18 ~~LOC~~+RFID (SPONGE) ×3 IMPLANT
SURGIFLO W/THROMBIN 8M KIT (HEMOSTASIS) ×1 IMPLANT
SUT BONE WAX W31G (SUTURE) ×1 IMPLANT
SUT MNCRL AB 3-0 PS2 27 (SUTURE) ×2 IMPLANT
SUT MNCRL+ AB 3-0 CT1 36 (SUTURE) ×1 IMPLANT
SUT MONOCRYL AB 3-0 CT1 36IN (SUTURE) ×1
SUT VIC AB 0 CT1 27 (SUTURE)
SUT VIC AB 0 CT1 27XBRD ANTBC (SUTURE) ×1 IMPLANT
SUT VIC AB 1 CT1 18XCR BRD 8 (SUTURE) ×1 IMPLANT
SUT VIC AB 1 CT1 8-18 (SUTURE) ×3
SUT VIC AB 2-0 CT1 18 (SUTURE) ×1 IMPLANT
SYR BULB IRRIG 60ML STRL (SYRINGE) ×1 IMPLANT
SYR CONTROL 10ML LL (SYRINGE) ×1 IMPLANT
TOWEL GREEN STERILE (TOWEL DISPOSABLE) ×1 IMPLANT
TOWEL GREEN STERILE FF (TOWEL DISPOSABLE) ×1 IMPLANT
TRAY FOLEY MTR SLVR 16FR STAT (SET/KITS/TRAYS/PACK) ×1 IMPLANT
WATER STERILE IRR 1000ML POUR (IV SOLUTION) ×1 IMPLANT
YANKAUER SUCT BULB TIP NO VENT (SUCTIONS) ×1 IMPLANT

## 2022-10-24 NOTE — Discharge Instructions (Signed)

## 2022-10-24 NOTE — Anesthesia Procedure Notes (Signed)
Procedure Name: Intubation Date/Time: 10/24/2022 7:46 AM  Performed by: Cathren Harsh, CRNAPre-anesthesia Checklist: Patient identified, Emergency Drugs available, Suction available and Patient being monitored Patient Re-evaluated:Patient Re-evaluated prior to induction Oxygen Delivery Method: Circle System Utilized Preoxygenation: Pre-oxygenation with 100% oxygen Induction Type: IV induction and Rapid sequence Laryngoscope Size: Glidescope and 3 Grade View: Grade I Tube type: Oral Tube size: 7.0 mm Number of attempts: 1 Airway Equipment and Method: Stylet and Oral airway Placement Confirmation: ETT inserted through vocal cords under direct vision, positive ETCO2 and breath sounds checked- equal and bilateral Secured at: 21 cm Tube secured with: Tape Dental Injury: Teeth and Oropharynx as per pre-operative assessment

## 2022-10-24 NOTE — Transfer of Care (Signed)
Immediate Anesthesia Transfer of Care Note  Patient: Kelli Pennington  Procedure(s) Performed: TRANSFORAMINAL LUMBAR INTERBODY FUSION LUMBAR FOUR TO SACRAL ONE (Spine Lumbar)  Patient Location: PACU  Anesthesia Type:General  Level of Consciousness: awake, drowsy, patient cooperative, and responds to stimulation  Airway & Oxygen Therapy: Patient Spontanous Breathing and Patient connected to face mask oxygen  Post-op Assessment: Report given to RN, Post -op Vital signs reviewed and stable, and Patient moving all extremities X 4  Post vital signs: Reviewed and stable  Last Vitals:  Vitals Value Taken Time  BP    Temp    Pulse 90 10/24/22 1302  Resp 11 10/24/22 1302  SpO2 97 % 10/24/22 1302  Vitals shown include unvalidated device data.  Last Pain:  Vitals:   10/24/22 0638  TempSrc: Oral  PainSc:       Patients Stated Pain Goal: 0 (45/80/99 8338)  Complications: No notable events documented.

## 2022-10-24 NOTE — H&P (Signed)
History:  Kelli Pennington returns today for follow-up. Unfortunately her quality of life is continued to deteriorate and she is developing progressive right neuropathic leg pain in addition to her chronic left neuropathic leg pain. Despite injection therapy, physical therapy, activity modification, and opioid and nonopioid medications she has continued to deteriorate. Plan on moving forward with surgical management of her L4-5 L5-S1 pathology with a two-level TLIF  Past Medical History:  Diagnosis Date   ADHD (attention deficit hyperactivity disorder)    Allergic rhinitis    Anxiety    Asthma    Bipolar 2 disorder ([place])    Complication of anesthesia    "hard time waking up"   Depression    Hypothyroidism    Interstitial cystitis    Sleep apnea    "years ago" prior to weight loss    Allergies  Allergen Reactions   Codeine Itching    No current facility-administered medications on file prior to encounter.   Current Outpatient Medications on File Prior to Encounter  Medication Sig Dispense Refill   ALPRAZolam (XANAX) 0.5 MG tablet Take 0.5-1 tablets (0.25-0.5 mg total) by mouth 2 (two) times daily as needed for anxiety. 30 tablet 1   amphetamine-dextroamphetamine (ADDERALL) 30 MG tablet Take 1 tablet by mouth 2 (two) times daily. 60 tablet 0   buPROPion (WELLBUTRIN XL) 300 MG 24 hr tablet Take 1 tablet (300 mg total) by mouth daily. 90 tablet 0   divalproex (DEPAKOTE ER) 500 MG 24 hr tablet TAKE 2 TABLETS BY MOUTH EVERY DAY AT BEDTIME 180 tablet 1   fluticasone (FLONASE) 50 MCG/ACT nasal spray Two sprays each nostril once a day as needed for nasal congestion or drainage. 16 g 5   gabapentin (NEURONTIN) 600 MG tablet Take 1 tablet (600 mg total) by mouth 2 (two) times daily. 180 tablet 1   ibuprofen (ADVIL) 200 MG tablet Take 400-800 mg by mouth every 8 (eight) hours as needed for moderate pain.     lamoTRIgine (LAMICTAL) 100 MG tablet Take 1 tablet (100 mg total) by mouth daily. 90  tablet 1   levothyroxine (SYNTHROID) 75 MCG tablet Take 75 mcg by mouth daily before breakfast.  2   LINZESS 145 MCG CAPS capsule Take 145 mcg by mouth daily before breakfast.     albuterol (PROVENTIL HFA;VENTOLIN HFA) 108 (90 Base) MCG/ACT inhaler Inhale 2 puffs into the lungs every 4 (four) hours as needed for wheezing or shortness of breath. 18 g 1   budesonide-formoterol (SYMBICORT) 80-4.5 MCG/ACT inhaler Inhale 2 puffs into the lungs 2 (two) times daily. USE SPACER. 1 Inhaler 5   valACYclovir (VALTREX) 1000 MG tablet Take 1,000 mg by mouth daily as needed (outbreaks).      Physical Exam: Vitals:   10/24/22 0638  BP: 132/84  Pulse: 73  Resp: 17  Temp: (!) 97.4 F (36.3 C)  SpO2: 99%   Body mass index is 21.63 kg/m. Clinical exam: Sivan is a pleasant individual, who appears younger than their stated age.  She is alert and orientated 3.  No shortness of breath, chest pain.  Abdomen is soft and non-tender, negative loss of bowel and bladder control, no rebound tenderness.  Negative: skin lesions abrasions contusions  Peripheral pulses: 2+ peripheral pulses bilaterally. LE compartments are: Soft and nontender.  Gait pattern: Altered gait pattern due to back pain radiating into the lower extremity  Assistive devices: None  Neuro: Decreased sensation to light touch with dysesthesias in the L5 dermatome  bilaterally right worse than the left. 5/5 motor strength in the lower extremity bilaterally. Negative Babinski test, no clonus, 1+ deep tendon reflexes symmetrically.  Musculoskeletal: Significant back pain with palpation and range of motion. Decreased overall flexion and extension is noted. Limited rotation due to severe pain. No SI joint pain on direct palpation. No hip, knee, ankle pain with isolated joint range of motion.  Imaging: X-rays of the lumbar spine including flexion-extension views were reviewed. 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 Patient has advanced degenerative disc disease L4-5 with approximately 3 mm of retrolisthesis on flexion-extension views.  Lumbar MRI: completed on 06/05/2022. 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 Progression in degenerative disc disease and endplate edema at E3-3. Mild improvement at L3-4 but the degenerative disc disease persists. No fracture or abnormal marrow signal change noted.  A/P: Summary: Kelli Pennington returns today for follow-up. Unfortunately her quality of life is continued to deteriorate and she is developing progressive right neuropathic leg pain in addition to her chronic left neuropathic leg pain. Despite injection therapy, physical therapy, activity modification, and opioid and nonopioid medications she has continued to deteriorate.  Based on her complaints I believe the pseudoarticulation (Bertolotti syndrome) at L5-S1 and the degenerative retrolisthesis at L4-5 is her primary issue. Having tried and failed conservative management I think it is reasonable to move forward with surgery.   Diagnosis: 1. 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  2. 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 At this point I believe a posterior based two-level TLIF would allow for direct neural decompression and stabilization of the symptomatic levels. Given the positioning of the L5-S1 disc in the pelvis I believe approaching this anteriorly will be difficult. I had a long conversation with the patient and I believe the reason the L5-S1 intradiscal injection did not provide relief was that her main issue is the pseudoarticulation. Having failed conservative treatment fusion surgery is warranted. By removing the motion at L5-S1 we  can address the pseudoarticulation and the pain that it is causing. In addition the adjacent L4-5 level does show motion on flexion-extension views with advancing degenerative disc disease and I do believe this is contributing to her pain. At this point the patient expressed understanding of the surgical plan and willingness to move forward with surgery.   Risks and benefits of spinal fusion: Infection, bleeding, death, stroke, paralysis, ongoing or worse pain, need for additional surgery, nonunion, leak of spinal fluid, adjacent segment degeneration requiring additional fusion surgery, Injury to abdominal vessels that can require anterior surgery to stop bleeding. Malposition of the cage and/or pedicle screws that could require additional surgery. Loss of bowel and bladder control. Postoperative hematoma causing neurologic compression that could require urgent or emergent re-operation.

## 2022-10-24 NOTE — Brief Op Note (Signed)
10/24/2022  1:26 PM  PATIENT:  Kelli Pennington  51 y.o. 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

## 2022-10-24 NOTE — Op Note (Signed)
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  Bertolotti's syndrome L5-S1 with radiculopathy  POST-OPERATIVE DIAGNOSIS: Same  PROCEDURE:   TLIF L4-5, and L5-S1  SURGEON:  Venita Lick, MD  PHYSICIAN ASSISTANT: None  ANESTHESIA:   General  EBL: 500 ml   Complications: Direct stimulation of the left S1 screw showed positive reaction at 6 mA.  The screw was removed and was able to directly evaluate the pedicle after performing foraminotomy and L5-S1 lateral recess decompression.  There is no evidence of broach of the pedicle on direct examination.  As result the screw was replaced.  Left L5 pedicle did breach and so decision was made not to place a screw at this level.  Implants: NuVasive expandable TLIF cage 7 x 9 x 24.  L5-S1: Expanded to anterior height of 10.4 and posterior height of 9.2.  4 degrees lordosis.  L4-5: Anterior height expanded to 9.5 and posterior height to 9.3.  0 degree lordosis.  NuVasive cortical pedicle screws 5.5 x 40 mm length screws L4.  Right L5: 5.5 x 40 mm length.  6.5 x 35 mm length screws at S1.  Graft: Autograft from decompression, and DBX mix.  Monitoring: The conclusion of the case there was no adverse free running EMG or SSEP activity.  All 5 pedicle screws were directly stimulated.  L4 and L5 pedicle screws there is no activity greater than 20 mA.  There is positive activity at 7 mA on the right-sided S1 and 6 mA on the left side of S1.  BRIEF HISTORY: Kelli Pennington is a 51 y.o. female who has been suffering from significant back buttock and radicular left leg pain.  Despite appropriate conservative management her overall quality of life it failed to improve.  As result of the ongoing pain and loss in quality of life we elected to move forward with surgery.  All appropriate risks,  benefits, and alternatives to surgery were discussed with the patient and consent was obtained.  PROCEDURE DETAILS: Patient was brought into the operating room and was properly positioned on the operating room table.  After induction with general anesthesia the patient was endotracheally intubated.  A timeout was taken to confirm all important data: including patient, procedure, and the level. Teds, SCD's were applied.   A Foley was placed by the nurse and all neuro monitoring devices were attached by the representative.  The patient was then turned prone onto the spine frame.  Bony prominences were well-padded and the back was prepped and draped in standard fashion.  Using fluoroscopy I marked out my midline incision spanning from the midportion of the L4 pedicle to the inferior aspect of the S1 pedicle.  Midline incision was infiltrated with quarter percent Marcaine and I made a sharp incision dissected down to the deep fascia.  The fascia was sharply incised and I stripped the paraspinal muscles to expose the L4, L5, and S1 spinous process and lamina.  The facet joints at L3-4, L4-5, and L5-S1 were then exposed.  Once I had the posterior exposure I then placed my retractors and prepared to move forward with the cortical pedicle screws.  Using AP fluoroscopy identified the starting position on the right L4 pedicle and used a high-speed bur to broach the cortex.  I then used a awl and advanced it starting at the 7 o'clock position aiming towards the 1 o'clock  position.  As I neared the lateral wall I confirmed I was just beyond the posterior wall the vertebral body in the AP view.  I then removed the all and palpated the hole with a ball-tipped feeler to confirm that it was intact.  I then placed the tap and then repalpated the hole.  And then placed the screw.  Using a similar technique placed the L4 pedicle screw on the left side.  I then placed the right L5 cortical pedicle screw using fluoroscopy.  When  I was placing the left and noted there was some distortion to the pedicle on the AP view and unfortunately as a result I breached the medial cortex.  As I removed the awl I was  able to palpate the reach.  As result I elected not to use the left L5 cortical screw.   The S1 screws were placed in similar fashion using fluoroscopy.  I then directly stimulated all 5 pedicle screws.  The left S1 pedicle did come in at 6 mA.  I elected to keep it and reassess it after the decompression.  With all 5 screws properly positioned I then proceeded with the Gill decompression.  A generous L5 laminotomy was performed with a double-action Leksell rongeur and Kerrison rongeurs.  I removed the entire inferior L5 facet with an osteotome.  A Penfield 4 was then used to dissect through the ligamentum flavum and create a plane between the thecal sac and the ligamentum flavum.  Using a Kerrison rongeur I remove the ligamentum flavum.  This allowed me to go into the lateral recess and complete the medial facetectomy.  I then continued superiorly performed a complete L5 and  removed the entire pars.  I could now visualize the L5 nerve root in the foramen.  I was also able to palpate the inferior aspect of the pedicle of L5.  I then proceeded inferiorly tracking the S1 nerve root into the foramen.  At this point I could no visualize the needle and the inferior aspect of the S1 pedicle.  I removed the pedicle screw and again directly palpated down the pedicle.  There was no evidence of a breach nor could I see or palpate the threads of the screw.  The result I did screw back in.  Again it had excellent purchase.  At this point with the L5 and S1 nerve roots compressed I turned my attention to the discectomy.  An annulotomy was performed with a 15 blade scalpel and I remove the bulk of the disc material with pituitary rongeurs, curettes, and Kerrison rongeurs.  I was able to use angled curettes over to the right-hand side and remove the  abundant disc material.  I confirmed with fluoroscopy that I was not going too far anteriorly.  Once I remove the bulk of the disc material I then irrigated the wound copiously normal saline.  I then obtained the expandable intervertebral cage and inserted.  A nerve root retractor was used to protect the thecal sac and I advanced the intervertebral cage on an angle so that it would cross over the midline.  Once it was at the appropriate depth and I confirmed this with fluoroscopy I then expanded the anterior and posterior aspects of the cage.  Once I was at the a final height I then moved the inserting device.  The cage was properly recessed in the intervertebral space and I was not contacting the thecal sac or the nerve roots.  I then placed DBX  directly into the cage and backfilled it.  Along the lateral aspect of the cage I packed the autograft bone to aid in the fusion.  At this point was complete with the intervertebral cage at this level.  I repositioned my retractor to expose the L4-5 level and using the same technique I performed a left-sided laminotomy and complete inferior facetectomy of L4.  I also resected the majority of the spinous process of L4 to receive autograft bone for posterior lateral fusion.  I again dissected through the ligamentum flavum resecting and completing my laminotomy of L4.  And remove the entire L4 pars and then resected the superior portion of the L5 superior facet.  Once this was done I could now visualize the L4 and L5 nerve roots in their entirety.  The L5 nerve root was completely free as it traversed the disc space and I could track it into the L5 foramen at the level below.  I could also visualize the L4 nerve root in the foramen and it was completely decompressed as I had removed the pars.  I was able to freely palpate superiorly in the lateral recess confirming its adequate decompression.  At this point an annulotomy was performed with a 15 blade scalpel and I used  pituitary rongeurs curettes and Kerrison rongeurs to remove all of the disc material.  Once I had adequate discectomy I then irrigated copiously normal saline and inserted the cage.  I used the same technique that had used at L5-S1.  Once I confirmed satisfactory positioning of the cage and depth I then expanded the anterior and posterior margins until it was properly fitting the intervertebral space.  Once this was done I removed the inserter and then backfilled the cage with DBX mix.  I then placed autograft bone along the lateral aspect of the cage to aid in the overall fusion.  At this point imaging performed demonstrate satisfactory position of both intervertebral cages in all 5 pedicles.  The wound at this point was copiously irrigated with saline and then I used a high-speed burr to decorticate the facet complex and transverse processes of S1, L5, and L4.  The right posterior lateral gutter was then packed with autograft bone.  Polyaxial heads were then affixed to the screws and the rod was then secured in place.  The skin caps were engaged and torqued according manufacture standards.  On the left side I just placed the rod as there was no surface for bone graft.  Once all 5 locking caps were torqued and tightened according manufacture standards I performed a final irrigation of the wound.  Hemostasis was confirmed and the deep fascia was closed with interrupted #1 Vicryl suture.  I followed this on anatomical planes with interrupted 2-0 Vicryl suture, and finally a 3-0 Monocryl for the skin.  Steri-Strips and dry dressings were applied.  Patient was ultimately extubated transfer the PACU without incident.  The end of the case all needle sponge counts were correct.  There were no adverse intraoperative events.  As result I elected not to use this  Melina Schools, MD 10/24/2022 12:58 PM

## 2022-10-24 NOTE — Anesthesia Postprocedure Evaluation (Signed)
Anesthesia Post Note  Patient: Kelli Pennington  Procedure(s) Performed: TRANSFORAMINAL LUMBAR INTERBODY FUSION LUMBAR FOUR TO SACRAL ONE (Spine Lumbar)     Patient location during evaluation: PACU Anesthesia Type: General Level of consciousness: awake and alert Pain management: pain level controlled Vital Signs Assessment: post-procedure vital signs reviewed and stable Respiratory status: spontaneous breathing, nonlabored ventilation, respiratory function stable and patient connected to nasal cannula oxygen Cardiovascular status: blood pressure returned to baseline and stable Postop Assessment: no apparent nausea or vomiting Anesthetic complications: no   No notable events documented.  Last Vitals:  Vitals:   10/24/22 1445 10/24/22 1511  BP: 96/60 106/68  Pulse: 75 70  Resp: 13 16  Temp: 37.4 C 36.7 C  SpO2: 92% 98%    Last Pain:  Vitals:   10/24/22 1511  TempSrc: Oral  PainSc:                  March Rummage Jerline Linzy

## 2022-10-25 ENCOUNTER — Encounter (HOSPITAL_COMMUNITY): Payer: Self-pay | Admitting: Orthopedic Surgery

## 2022-10-25 NOTE — Progress Notes (Signed)
    Subjective: Procedure(s) (LRB): TRANSFORAMINAL LUMBAR INTERBODY FUSION LUMBAR FOUR TO SACRAL ONE (N/A) 1 Day Post-Op  Patient reports pain as 3 on 0-10 scale.  Reports decreased leg pain reports incisional back pain   Positive void Negative bowel movement Positive flatus Negative chest pain or shortness of breath  Objective: Vital signs in last 24 hours: Temp:  [97.7 F (36.5 C)-99.4 F (37.4 C)] 98.2 F (36.8 C) (11/07 0325) Pulse Rate:  [70-90] 79 (11/07 0449) Resp:  [11-24] 18 (11/07 0325) BP: (92-108)/(57-68) 100/60 (11/07 0449) SpO2:  [92 %-100 %] 100 % (11/07 0412)  Intake/Output from previous day: 11/06 0701 - 11/07 0700 In: 2033 [P.O.:720; I.V.:1213; IV Piggyback:100] Out: 5150 [Urine:4650; Blood:500]  Labs: No results for input(s): "WBC", "RBC", "HCT", "PLT" in the last 72 hours. No results for input(s): "NA", "K", "CL", "CO2", "BUN", "CREATININE", "GLUCOSE", "CALCIUM" in the last 72 hours. No results for input(s): "LABPT", "INR" in the last 72 hours.  Physical Exam: Neurologically intact ABD soft Intact pulses distally Incision: dressing C/D/I and no drainage Compartment soft Body mass index is 21.63 kg/m.   Assessment/Plan: Patient stable  xrays n/a Continue mobilization with physical therapy Continue care  Advance diet Up with therapy Plan on d/c today if cleared by PT and pain controlled with meds   Melina Schools, MD [facility] [phone]

## 2022-10-25 NOTE — Plan of Care (Signed)
Pt doing well. Pt and husband given D/C instructions with verbal understanding. Rx's were given to the Pt at D/C. Pt's incision is clean and dry with no sign of infection. Pt's IV was removed prior to D/C. Pt received 3-n-1 from [employer] per MD order. Pt D/C'd home via wheelchair per MD order. Pt is stable @ D/C and has no other needs at this time. Holli Humbles, RN

## 2022-10-25 NOTE — Evaluation (Signed)
Occupational Therapy Evaluation Patient Details Name: Kelli Pennington MRN: [id number] DOB: 1971/11/16 Today's Date: 10/25/2022   History of Present Illness 51 yo female with progressive right neuropathic leg pain in addition to her chronic left neuropathic leg pain. S/p 10/24/22 TILF L4-5 L5-S1   Clinical Impression   [name] was evaluated s/p the above admission list, she is generally indep at baseline and lives with her husband. Upon evaluation she had functional limitations due to sx pain, weakness, poor activity tolerance and carry over of compensatory techniques to maintain back precautions. Pt was significantly limited by pain this date and required up to mod A for LB ADLs and min G for mobility without AD. Pt also reported concern of her low BP, asymptomatic during session. Anticipate great progression once pain is managed. OT to continue to follow acutely. Recommend HHOT.    Recommendations for follow up therapy are one component of a multi-disciplinary discharge planning process, led by the attending physician.  Recommendations may be updated based on patient status, additional functional criteria and insurance authorization.   Follow Up Recommendations  Home health OT    Assistance Recommended at Discharge Frequent or constant Supervision/Assistance  Patient can return home with the following A lot of help with walking and/or transfers;A little help with bathing/dressing/bathroom;Assist for transportation;Help with stairs or ramp for entrance    Functional Status Assessment  Patient has had a recent decline in their functional status and demonstrates the ability to make significant improvements in function in a reasonable and predictable amount of time.  Equipment Recommendations  BSC/3in1       Precautions / Restrictions Precautions Precautions: Fall;Back Precaution Booklet Issued: Yes (comment) Required Braces or Orthoses: Spinal Brace Spinal Brace: Applied in sitting  position Restrictions Weight Bearing Restrictions: No      Mobility Bed Mobility Overal bed mobility: Needs Assistance Bed Mobility: Rolling, Sit to Sidelying Rolling: Supervision       Sit to sidelying: Min assist General bed mobility comments: cues and assist for LEs    Transfers Overall transfer level: Needs assistance Equipment used: None Transfers: Sit to/from Stand, Bed to chair/wheelchair/BSC Sit to Stand: Min guard Stand pivot transfers: Min guard         General transfer comment: pt declined further mobility due to pain      Balance Overall balance assessment: Needs assistance Sitting-balance support: Feet supported Sitting balance-Leahy Scale: Fair     Standing balance support: No upper extremity supported, During functional activity Standing balance-Leahy Scale: Fair           ADL either performed or assessed with clinical judgement   ADL Overall ADL's : Needs assistance/impaired Eating/Feeding: Independent   Grooming: Set up;Sitting   Upper Body Bathing: Minimal assistance;Sitting   Lower Body Bathing: Sit to/from stand;Moderate assistance   Upper Body Dressing : Set up;Sitting   Lower Body Dressing: Sit to/from stand;Moderate assistance   Toilet Transfer: Min guard;Ambulation   Toileting- Clothing Manipulation and Hygiene: Supervision/safety;Sitting/lateral lean       Functional mobility during ADLs: Minimal assistance;Rolling walker (2 wheels) General ADL Comments: limited by pain adn carryover of back precautions     Vision Baseline Vision/History: 0 No visual deficits Vision Assessment?: No apparent visual deficits            Pertinent Vitals/Pain Pain Assessment Pain Assessment: Faces Faces Pain Scale: Hurts whole lot Pain Location: back Pain Descriptors / Indicators: Discomfort Pain Intervention(s): Monitored during session, Limited activity within patient's tolerance     Hand Dominance  Right   Extremity/Trunk  Assessment Upper Extremity Assessment Upper Extremity Assessment: Overall WFL for tasks assessed   Lower Extremity Assessment Lower Extremity Assessment: Defer to PT evaluation   Cervical / Trunk Assessment Cervical / Trunk Assessment: Back Surgery   Communication Communication Communication: No difficulties   Cognition Arousal/Alertness: Awake/alert Behavior During Therapy: WFL for tasks assessed/performed Overall Cognitive Status: Within Functional Limits for tasks assessed         General Comments: good recall of precautions, required cues for carryover     General Comments  VSS on RA, spouse present. Session limited by pain            Home Living Family/patient expects to be discharged to:: Private residence Living Arrangements: Spouse/significant other Available Help at Discharge: Family;Available 24 hours/day Type of Home: House Home Access: Stairs to enter [employer] of Steps: 2         Bathroom Shower/Tub: Tub/shower unit;Walk-in shower   Bathroom Toilet: Standard     Home Equipment: None          Prior Functioning/Environment Prior Level of Function : Independent/Modified Independent;Driving                        OT Problem List: Decreased strength;Decreased range of motion;Decreased activity tolerance;Impaired balance (sitting and/or standing);Decreased safety awareness;Decreased knowledge of use of DME or AE;Decreased knowledge of precautions;Pain      OT Treatment/Interventions: Self-care/ADL training;Therapeutic exercise;DME and/or AE instruction;Therapeutic activities;Patient/family education;Balance training    OT Goals(Current goals can be found in the care plan section) Acute Rehab OT Goals Patient Stated Goal: less pain OT Goal Formulation: With patient Time For Goal Achievement: 11/08/22 Potential to Achieve Goals: Good ADL Goals Additional ADL Goal #1: Pt will indep complete ADLs Additional ADL Goal #2: Pt  will indep maintain back precautions during functional activity  OT Frequency: Min 2X/week       AM-PAC OT "6 Clicks" Daily Activity     Outcome Measure Help from another person eating meals?: None Help from another person taking care of personal grooming?: A Little Help from another person toileting, which includes using toliet, bedpan, or urinal?: A Little Help from another person bathing (including washing, rinsing, drying)?: A Lot Help from another person to put on and taking off regular upper body clothing?: A Little Help from another person to put on and taking off regular lower body clothing?: A Lot 6 Click Score: 17   End of Session Nurse Communication: Mobility status  Activity Tolerance: Patient tolerated treatment well Patient left: in bed;with call bell/phone within reach;with family/visitor present  OT Visit Diagnosis: Unsteadiness on feet (R26.81);Other abnormalities of gait and mobility (R26.89);Muscle weakness (generalized) (M62.81);Pain                Time: [id number] OT Time Calculation (min): 16 min Charges:  OT General Charges $OT Visit: 1 Visit OT Evaluation $OT Eval Moderate Complexity: 1 Mod   Elaine Roanhorse D Causey 10/25/2022, 9:27 AM

## 2022-10-25 NOTE — Evaluation (Signed)
Physical Therapy Evaluation Patient Details Name: Kelli Pennington MRN: [id number] DOB: 01/02/1971 Today's Date: 10/25/2022  History of Present Illness  51 yo female with progressive right neuropathic leg pain in addition to her chronic left neuropathic leg pain. S/p 10/24/22 TILF L4-5 L5-S1  Clinical Impression  PTA pt living with husband in multistory home with master bed and bath on main level. Pt was independent with ambulation household levels, unable to ambulate further due to back pain. Pt independent in ADLs and husband assists with iADLs. Pt currently limited in safe mobility by increased back pain in presence of decreased adherence to back precautions. Pt does not require physical assist for mobility but will need assist for iADLs at discharge. Pt to discharge home this afternoon and will not have any PT needs at that time. If plans change PT will continue to follow acutely.       Recommendations for follow up therapy are one component of a multi-disciplinary discharge planning process, led by the attending physician.  Recommendations may be updated based on patient status, additional functional criteria and insurance authorization.  Follow Up Recommendations No PT follow up      Assistance Recommended at Discharge Frequent or constant Supervision/Assistance  Patient can return home with the following  A little help with walking and/or transfers;A little help with bathing/dressing/bathroom;Assistance with cooking/housework;Assist for transportation;Help with stairs or ramp for entrance    Equipment Recommendations BSC/3in1  Recommendations for Other Services  OT consult    Functional Status Assessment Patient has had a recent decline in their functional status and demonstrates the ability to make significant improvements in function in a reasonable and predictable amount of time.     Precautions / Restrictions Precautions Precautions: Fall;Back Precaution Booklet Issued: Yes  (comment) Precaution Comments: able to recall precautions but not able to adhere to them bending to pick up her heavy bag and twisting to place it on the bedto Required Braces or Orthoses: Spinal Brace Spinal Brace: Applied in sitting position Restrictions Weight Bearing Restrictions: No      Mobility  Bed Mobility Overal bed mobility: Needs Assistance Bed Mobility: Rolling, Sidelying to Sit Rolling: Supervision Sidelying to sit: Min guard       General bed mobility comments: min guard and cues for not twisting with bringing trunk to upright    Transfers Overall transfer level: Needs assistance Equipment used: None Transfers: Sit to/from Stand, Bed to chair/wheelchair/BSC Sit to Stand: Min guard           General transfer comment: min guard for power up and steadying EoB    Ambulation/Gait Ambulation/Gait assistance: Min guard Gait Distance (Feet): 300 Feet Assistive device: None Gait Pattern/deviations: Step-through pattern, WFL(Within Functional Limits) Gait velocity: slowed Gait velocity interpretation: 1.31 - 2.62 ft/sec, indicative of limited community ambulator   General Gait Details: min guard for safety, cues for not twisting to talk to therapist  Stairs Stairs: Yes Stairs assistance: Min guard Stair Management: One rail Left, Forwards, Step to pattern Number of Stairs: 2 General stair comments: strong steady ascent/descent of steps  Wheelchair Mobility    Modified Rankin (Stroke Patients Only)       Balance Overall balance assessment: Needs assistance Sitting-balance support: Feet supported Sitting balance-Leahy Scale: Fair     Standing balance support: No upper extremity supported, During functional activity Standing balance-Leahy Scale: Fair  Pertinent Vitals/Pain Pain Assessment Pain Assessment: Faces Faces Pain Scale: Hurts whole lot Pain Location: back Pain Descriptors / Indicators:  Discomfort Pain Intervention(s): Limited activity within patient's tolerance, Monitored during session, Repositioned, Patient requesting pain meds-RN notified, RN gave pain meds during session    Home Living Family/patient expects to be discharged to:: Private residence Living Arrangements: Spouse/significant other Available Help at Discharge: Family;Available 24 hours/day Type of Home: House Home Access: Stairs to enter   Entrance Stairs-Number of Steps: 2 Alternate Level Stairs-Number of Steps: 15 Home Layout: Multi-level;Laundry or work area in [employer]: None      Prior Function Prior Level of Function : Independent/Modified Independent;Driving                     Hand Dominance   Dominant Hand: Right    Extremity/Trunk Assessment   Upper Extremity Assessment Upper Extremity Assessment: Defer to OT evaluation    Lower Extremity Assessment Lower Extremity Assessment: Overall WFL for tasks assessed    Cervical / Trunk Assessment Cervical / Trunk Assessment: Back Surgery  Communication   Communication: No difficulties  Cognition Arousal/Alertness: Awake/alert Behavior During Therapy: WFL for tasks assessed/performed Overall Cognitive Status: Within Functional Limits for tasks assessed                                 General Comments: good recall of precautions, required cues for carryover        General Comments General comments (skin integrity, edema, etc.): VSS on RA, requested pain medication and RN provided at end of session    Exercises     Assessment/Plan    PT Assessment Patient needs continued PT services  PT Problem List Decreased strength;Decreased activity tolerance;Decreased mobility;Pain       PT Treatment Interventions Gait training;Functional mobility training;Therapeutic activities;Therapeutic exercise;Balance training;Stair training;Cognitive remediation;Patient/family education    PT Goals (Current  goals can be found in the Care Plan section)  Acute Rehab PT Goals Patient Stated Goal: have less pain PT Goal Formulation: With patient Time For Goal Achievement: 11/08/22 Potential to Achieve Goals: Fair    Frequency 7X/week        AM-PAC PT "6 Clicks" Mobility  Outcome Measure Help needed turning from your back to your side while in a flat bed without using bedrails?: None Help needed moving from lying on your back to sitting on the side of a flat bed without using bedrails?: None Help needed moving to and from a bed to a chair (including a wheelchair)?: None Help needed standing up from a chair using your arms (e.g., wheelchair or bedside chair)?: None Help needed to walk in hospital room?: None Help needed climbing 3-5 steps with a railing? : None 6 Click Score: 24    End of Session Equipment Utilized During Treatment: Gait belt;Back brace Activity Tolerance: Patient limited by pain Patient left: in chair;with call bell/phone within reach Nurse Communication: Mobility status PT Visit Diagnosis: Muscle weakness (generalized) (M62.81);Difficulty in walking, not elsewhere classified (R26.2);Pain Pain - part of body:  (back)    Time: [id number] PT Time Calculation (min) (ACUTE ONLY): 24 min   Charges:   PT Evaluation $PT Eval Moderate Complexity: 1 Mod PT Treatments $Gait Training: 8-22 mins        Estrella Alcaraz B. Migdalia Dk PT, DPT Acute Rehabilitation Services Please use secure chat or  Call Office [phone]   [place] 10/25/2022,  10:19 AM

## 2022-10-26 NOTE — Discharge Summary (Signed)
Patient ID: Kelli Pennington MRN: [id number] DOB/AGE: 09-10-1971 51 y.o.  Admit date: 10/24/2022 Discharge date: 10/26/2022  Admission Diagnoses:  Principal Problem:   S/P lumbar fusion   Discharge Diagnoses:  Principal Problem:   S/P lumbar fusion  status post Procedure(s): TRANSFORAMINAL LUMBAR INTERBODY FUSION LUMBAR FOUR TO SACRAL ONE  Past Medical History:  Diagnosis Date   ADHD (attention deficit hyperactivity disorder)    Allergic rhinitis    Anxiety    Asthma    Bipolar 2 disorder (HCC)    Complication of anesthesia    "hard time waking up"   Depression    Hypothyroidism    Interstitial cystitis    Sleep apnea    "years ago" prior to weight loss    Surgeries: Procedure(s): TRANSFORAMINAL LUMBAR INTERBODY FUSION LUMBAR FOUR TO SACRAL ONE on 10/24/2022   Consultants:   Discharged Condition: Improved  Hospital Course: Kelli Pennington is an 51 y.o. female who was admitted 10/24/2022 for operative treatment of S/P lumbar fusion. Patient failed conservative treatments (please see the history and physical for the specifics) and had severe unremitting pain that affects sleep, daily activities and work/hobbies. After pre-op clearance, the patient was taken to the operating room on 10/24/2022 and underwent  Procedure(s): TRANSFORAMINAL LUMBAR INTERBODY FUSION LUMBAR FOUR TO SACRAL ONE.    Patient was given perioperative antibiotics:  Anti-infectives (From admission, onward)    Start     Dose/Rate Route Frequency Ordered Stop   10/24/22 2000  ceFAZolin (ANCEF) IVPB 1 g/50 mL premix        1 g 100 mL/hr over 30 Minutes Intravenous Every 8 hours 10/24/22 1454 10/25/22 0347   10/24/22 0555  ceFAZolin (ANCEF) IVPB 2g/100 mL premix        2 g 200 mL/hr over 30 Minutes Intravenous 30 min pre-op 10/24/22 0555 10/24/22 1200        Patient was given sequential compression devices and early ambulation to prevent DVT.   Patient benefited maximally from hospital stay and there  were no complications. At the time of discharge, the patient was urinating/moving their bowels without difficulty, tolerating a regular diet, pain is controlled with oral pain medications and they have been cleared by PT/OT.   Recent vital signs: Patient Vitals for the past 24 hrs:  BP Temp Temp src Pulse Resp SpO2  10/25/22 1210 106/67 98.4 F (36.9 C) Oral 83 16 100 %  10/25/22 0857 100/61 98.6 F (37 C) Oral 82 16 95 %     Recent laboratory studies: No results for input(s): "WBC", "HGB", "HCT", "PLT", "NA", "K", "CL", "CO2", "BUN", "CREATININE", "GLUCOSE", "INR", "CALCIUM" in the last 72 hours.  Invalid input(s): "PT", "2"   Discharge Medications:   Allergies as of 10/25/2022       Reactions   Codeine Itching        Medication List     STOP taking these medications    ibuprofen 200 MG tablet Commonly known as: ADVIL   valACYclovir 1000 MG tablet Commonly known as: VALTREX       TAKE these medications    albuterol 108 (90 Base) MCG/ACT inhaler Commonly known as: VENTOLIN HFA Inhale 2 puffs into the lungs every 4 (four) hours as needed for wheezing or shortness of breath.   ALPRAZolam 0.5 MG tablet Commonly known as: XANAX Take 0.5-1 tablets (0.25-0.5 mg total) by mouth 2 (two) times daily as needed for anxiety.   amphetamine-dextroamphetamine 30 MG tablet Commonly known as: ADDERALL Take 1 tablet by  mouth 2 (two) times daily.   budesonide-formoterol 80-4.5 MCG/ACT inhaler Commonly known as: Symbicort Inhale 2 puffs into the lungs 2 (two) times daily. USE SPACER.   buPROPion 300 MG 24 hr tablet Commonly known as: WELLBUTRIN XL Take 1 tablet (300 mg total) by mouth daily.   divalproex 500 MG 24 hr tablet Commonly known as: DEPAKOTE ER TAKE 2 TABLETS BY MOUTH EVERY DAY AT BEDTIME   fluticasone 50 MCG/ACT nasal spray Commonly known as: FLONASE Two sprays each nostril once a day as needed for nasal congestion or drainage.   gabapentin 600 MG  tablet Commonly known as: NEURONTIN Take 1 tablet (600 mg total) by mouth 2 (two) times daily.   lamoTRIgine 100 MG tablet Commonly known as: LAMICTAL Take 1 tablet (100 mg total) by mouth daily.   levothyroxine 75 MCG tablet Commonly known as: SYNTHROID Take 75 mcg by mouth daily before breakfast.   Linzess 145 MCG Caps capsule Generic drug: linaclotide Take 145 mcg by mouth daily before breakfast.   methocarbamol 500 MG tablet Commonly known as: ROBAXIN Take 1 tablet (500 mg total) by mouth every 8 (eight) hours as needed for up to 5 days for muscle spasms.   ondansetron 4 MG tablet Commonly known as: Zofran Take 1 tablet (4 mg total) by mouth every 8 (eight) hours as needed for nausea or vomiting.   oxyCODONE-acetaminophen 10-325 MG tablet Commonly known as: Percocet Take 1 tablet by mouth every 6 (six) hours as needed for up to 5 days for pain.        Diagnostic Studies: DG Lumbar Spine 2-3 Views  Result Date: 10/24/2022 CLINICAL DATA:  Intraoperative fluoroscopic images from transforaminal lumbar interbody fusion EXAM: LUMBAR SPINE - 3 VIEW COMPARISON:  None Available. FINDINGS: Non-diagnostic spot fluoroscopy images from surgical planning purposes. L4-S1 posterior fusion hardware with interbody disc spacers are visualized. No unexpected radiopaque foreign body. Please reference procedure report for further details. IMPRESSION: Non-diagnostic spot fluoroscopy images from surgical planning purposes. Please reference procedure report for further details. Electronically Signed   By: Jacob Moores M.D.   On: 10/24/2022 12:48   DG C-Arm 1-60 Min-No Report  Result Date: 10/24/2022 Fluoroscopy was utilized by the requesting physician.  No radiographic interpretation.   DG C-Arm 1-60 Min-No Report  Result Date: 10/24/2022 Fluoroscopy was utilized by the requesting physician.  No radiographic interpretation.   DG C-Arm 1-60 Min-No Report  Result Date:  10/24/2022 Fluoroscopy was utilized by the requesting physician.  No radiographic interpretation.   DG C-Arm 1-60 Min-No Report  Result Date: 10/24/2022 Fluoroscopy was utilized by the requesting physician.  No radiographic interpretation.   DG C-Arm 1-60 Min-No Report  Result Date: 10/24/2022 Fluoroscopy was utilized by the requesting physician.  No radiographic interpretation.    Discharge Instructions     Incentive spirometry RT   Complete by: As directed         Follow-up Information     Venita Lick, MD. Schedule an appointment as soon as possible for a visit in 2 week(s).   Specialty: Orthopedic Surgery Why: If symptoms worsen, For suture removal, For wound re-check Contact information: [address] [place] [address] [phone]                 Discharge Plan:  discharge to home Disposition: Patient is status post two-level L4-S1 TLIF.  Overall she is doing exceptionally well.  She was ambulating with minimal assistance and was cleared for home discharge by PT.  She is tolerating a regular diet, voiding spontaneously and having positive flatus.  Patient was given appropriate discharge instructions and will follow-up with me in 2 weeks.    Signed: Alvy Beal for Dr. Venita Lick [facility] [phone] 10/26/2022, 7:43 AM

## 2022-12-01 ENCOUNTER — Other Ambulatory Visit: Payer: Self-pay | Admitting: Physician Assistant

## 2022-12-31 ENCOUNTER — Other Ambulatory Visit: Payer: Self-pay | Admitting: Physician Assistant

## 2023-01-02 ENCOUNTER — Encounter: Payer: Self-pay | Admitting: Physician Assistant

## 2023-01-02 ENCOUNTER — Ambulatory Visit (INDEPENDENT_AMBULATORY_CARE_PROVIDER_SITE_OTHER): Payer: 59 | Admitting: Physician Assistant

## 2023-01-02 DIAGNOSIS — F319 Bipolar disorder, unspecified: Secondary | ICD-10-CM | POA: Diagnosis not present

## 2023-01-02 DIAGNOSIS — F900 Attention-deficit hyperactivity disorder, predominantly inattentive type: Secondary | ICD-10-CM

## 2023-01-02 DIAGNOSIS — F4310 Post-traumatic stress disorder, unspecified: Secondary | ICD-10-CM

## 2023-01-02 DIAGNOSIS — F411 Generalized anxiety disorder: Secondary | ICD-10-CM

## 2023-01-02 MED ORDER — DIVALPROEX SODIUM ER 500 MG PO TB24: ORAL_TABLET | ORAL | 1 refills | Status: DC

## 2023-01-02 MED ORDER — AMPHETAMINE-DEXTROAMPHETAMINE 30 MG PO TABS: 30.0000 mg | ORAL_TABLET | Freq: Two times a day (BID) | ORAL | 0 refills | Status: DC

## 2023-01-02 MED ORDER — CITALOPRAM HYDROBROMIDE 10 MG PO TABS: 10.0000 mg | ORAL_TABLET | Freq: Every day | ORAL | 1 refills | Status: DC

## 2023-01-02 MED ORDER — ALPRAZOLAM 0.5 MG PO TABS: 0.2500 mg | ORAL_TABLET | Freq: Two times a day (BID) | ORAL | 1 refills | Status: DC | PRN

## 2023-01-02 NOTE — Progress Notes (Signed)
[facility] Check  Patient ID: Kelli Pennington,  MRN: [id number]  PCP: Deon Pilling, NP  Date of Evaluation: 01/02/2023 Time spent:20 minutes  Chief Complaint:  Chief Complaint   Anxiety; Depression; Follow-up    HISTORY/CURRENT STATUS: HPI More anxious  More anxious, gets panicky, needing the xanax more often. Not sure why. Work is very stressful. Hard time retaining things and spends more time than necessary on something. "Waste of time but I've always been like that."  Overwhelmed. Sleeps ok.   Anhedonia, low energy and motivation. Works from home and sometimes won't go out of the house for days.   No extreme sadness, tearfulness, or feelings of hopelessness.   ADLs and personal hygiene are normal.   Appetite has not changed.  Weight is stable.  Denies suicidal or homicidal thoughts.  Patient denies increased energy with decreased need for sleep, increased talkativeness, racing thoughts, impulsivity or risky behaviors, increased spending, increased libido, grandiosity, increased irritability or anger, paranoia, or hallucinations.  Denies dizziness, syncope, seizures, numbness, tingling, tremor, tics, unsteady gait, slurred speech, confusion. Denies muscle or joint pain, stiffness, or dystonia.  Individual Medical History/ Review of Systems: Changes? :Yes     had lumbar spine surgery in November.   Past medications for mental health diagnoses include: Depakote, Celexa, Wellbutrin, Klonopin, Prozac, Zoloft, Lexapro, Adderall  Allergies: Codeine  Current Medications:  Current Outpatient Medications:    buPROPion (WELLBUTRIN XL) 300 MG 24 hr tablet, TAKE 1 TABLET BY MOUTH EVERY DAY, Disp: 90 tablet, Rfl: 0   citalopram (CELEXA) 10 MG tablet, Take 1 tablet (10 mg total) by mouth daily., Disp: 30 tablet, Rfl: 1   fluticasone (FLONASE) 50 MCG/ACT nasal spray, Two sprays each nostril once a day as needed for nasal congestion or drainage., Disp: 16 g, Rfl: 5   gabapentin  (NEURONTIN) 600 MG tablet, Take 1 tablet (600 mg total) by mouth 2 (two) times daily., Disp: 180 tablet, Rfl: 1   lamoTRIgine (LAMICTAL) 100 MG tablet, Take 1 tablet (100 mg total) by mouth daily., Disp: 90 tablet, Rfl: 1   levothyroxine (SYNTHROID) 75 MCG tablet, Take 75 mcg by mouth daily before breakfast., Disp: , Rfl: 2   LINZESS 145 MCG CAPS capsule, Take 145 mcg by mouth daily before breakfast., Disp: , Rfl:    ondansetron (ZOFRAN) 4 MG tablet, Take 1 tablet (4 mg total) by mouth every 8 (eight) hours as needed for nausea or vomiting., Disp: 20 tablet, Rfl: 0   albuterol (PROVENTIL HFA;VENTOLIN HFA) 108 (90 Base) MCG/ACT inhaler, Inhale 2 puffs into the lungs every 4 (four) hours as needed for wheezing or shortness of breath. (Patient not taking: Reported on 01/02/2023), Disp: 18 g, Rfl: 1   ALPRAZolam (XANAX) 0.5 MG tablet, Take 0.5-1 tablets (0.25-0.5 mg total) by mouth 2 (two) times daily as needed for anxiety., Disp: 30 tablet, Rfl: 1   amphetamine-dextroamphetamine (ADDERALL) 30 MG tablet, Take 1 tablet by mouth 2 (two) times daily., Disp: 60 tablet, Rfl: 0   budesonide-formoterol (SYMBICORT) 80-4.5 MCG/ACT inhaler, Inhale 2 puffs into the lungs 2 (two) times daily. USE SPACER. (Patient not taking: Reported on 01/02/2023), Disp: 1 Inhaler, Rfl: 5   divalproex (DEPAKOTE ER) 500 MG 24 hr tablet, TAKE 2 TABLETS BY MOUTH EVERY DAY AT BEDTIME, Disp: 180 tablet, Rfl: 1 Medication Side Effects: sexual dysfunction  Family Medical/ Social History: Changes?  None  MENTAL HEALTH EXAM:  Last menstrual period 12/14/2018.There is no height or weight on file to calculate BMI.  General Appearance:  Casual and Well Groomed  Eye Contact:  Good  Speech:  Clear and Coherent and Normal Rate  Volume:  Normal  Mood:  Anxious  Affect:  Congruent  Thought Process:  Goal Directed and Descriptions of Associations: Circumstantial  Orientation:  Full (Time, Place, and Person)  Thought Content: Logical    Suicidal Thoughts:  No  Homicidal Thoughts:  No  Memory:  WNL  Judgement:  Good  Insight:  Good  Psychomotor Activity:  Normal  Concentration:  Concentration: Good and Attention Span: Good  Recall:  Good  Fund of Knowledge: Good  Language: Good  Assets:  Desire for Improvement  ADL's:  Intact  Cognition: WNL  Prognosis:  Good   DIAGNOSES:    ICD-10-CM   1. Bipolar I disorder ([place])  F31.9     2. PTSD (post-traumatic stress disorder)  F43.10     3. Attention deficit hyperactivity disorder (ADHD), predominantly inattentive type  F90.0     4. Generalized anxiety disorder  F41.1       Receiving Psychotherapy: No   RECOMMENDATIONS:  PDMP was reviewed. Last Adderall filled 10/20/2022.  Last Xanax filled 08/28/2022.  Gabapentin filled 12/30/2022. I provided 20 minutes of face to face time during this encounter, including time spent before and after the visit in records review, medical decision making, counseling pertinent to today's visit, and charting.   Recommend restarting Celexa. Had been on it for a long time but was feeling emotionless so we stopped it 1.5 years ago. Explained it helps with anxiety and depression.  Benefits, risk and side effects were discussed and she accepts.  Continue Xanax 0.5 mg, 1/2-1 p.o. twice daily as needed.  Use sparingly. Continue Adderall 30 mg, 1 p.o. every morning and 1 p.o. q. afternoon. Continue Wellbutrin XL 300 mg, 1 p.o. every morning. Restart Celexa 10 mg, 1 p.o. daily. Continue Depakote ER 500 mg, 2 p.o. nightly. Continue gabapentin 600 mg, 1 p.o. twice daily. Continue Lamictal 100 mg, 1 p.o. daily. Recommend restarting therapy. Release of information signed to get labs from PCP last fall. Return in 6 weeks.  Donnal Moat, PA-C

## 2023-01-02 NOTE — Telephone Encounter (Signed)
Has an appt with Teresa today. 

## 2023-01-24 ENCOUNTER — Other Ambulatory Visit: Payer: Self-pay | Admitting: Physician Assistant

## 2023-02-14 ENCOUNTER — Ambulatory Visit: Payer: Self-pay | Admitting: Physician Assistant

## 2023-02-27 ENCOUNTER — Other Ambulatory Visit: Payer: Self-pay | Admitting: Physician Assistant

## 2023-03-08 ENCOUNTER — Ambulatory Visit (INDEPENDENT_AMBULATORY_CARE_PROVIDER_SITE_OTHER): Payer: 59 | Admitting: Physician Assistant

## 2023-03-08 ENCOUNTER — Encounter: Payer: Self-pay | Admitting: Physician Assistant

## 2023-03-08 DIAGNOSIS — F411 Generalized anxiety disorder: Secondary | ICD-10-CM | POA: Diagnosis not present

## 2023-03-08 DIAGNOSIS — F900 Attention-deficit hyperactivity disorder, predominantly inattentive type: Secondary | ICD-10-CM | POA: Diagnosis not present

## 2023-03-08 DIAGNOSIS — F319 Bipolar disorder, unspecified: Secondary | ICD-10-CM | POA: Diagnosis not present

## 2023-03-08 DIAGNOSIS — F4310 Post-traumatic stress disorder, unspecified: Secondary | ICD-10-CM | POA: Diagnosis not present

## 2023-03-08 MED ORDER — GABAPENTIN 600 MG PO TABS: 600.0000 mg | ORAL_TABLET | Freq: Two times a day (BID) | ORAL | 1 refills | Status: DC

## 2023-03-08 MED ORDER — AMPHETAMINE-DEXTROAMPHETAMINE 30 MG PO TABS: 30.0000 mg | ORAL_TABLET | Freq: Two times a day (BID) | ORAL | 0 refills | Status: DC

## 2023-03-08 MED ORDER — LAMOTRIGINE 100 MG PO TABS: 100.0000 mg | ORAL_TABLET | Freq: Every day | ORAL | 1 refills | Status: DC

## 2023-03-08 NOTE — Progress Notes (Signed)
[facility]  Patient ID: Kelli Pennington,  MRN: [id number]  PCP: Deon Pilling, NP  Date of Evaluation: 03/08/2023 Time spent:30 minutes  Chief Complaint:  Chief Complaint   Anxiety; Depression; ADD; Follow-up    HISTORY/CURRENT STATUS: HPI for routine med check  We restarted Celexa 2 months ago. Has gained 10 #, craving food, eats even when she's not hungry.   Anxiety is worse every day, in the afternoon, she takes 1/4-1/2 of Xanax some days which helps. Not having PA, just gets overwhelmed. Lots of stress at work. She doesn't take it with Adderall.   Patient is able to enjoy thingsbut doesn't have a lot of time to do so because she works so much.  Energy and motivation are fair to good.  No extreme sadness, tearfulness, or feelings of hopelessness.  Sleeps well most of the time. ADLs and personal hygiene are normal.  Focus and concentration are mostly good in the morning but not so much in the afternoon.  She sometimes forgets to take the afternoon dose of Adderall.  Then she has decreased energy, motivation and has to slog through work.  Denies suicidal or homicidal thoughts.  Patient denies increased energy with decreased need for sleep, increased talkativeness, racing thoughts, impulsivity or risky behaviors, increased spending, increased libido, grandiosity, increased irritability or anger, paranoia, or hallucinations.  Denies dizziness, syncope, seizures, numbness, tingling, tremor, tics, unsteady gait, slurred speech, confusion. Denies muscle or joint pain, stiffness, or dystonia.  Individual Medical History/ Review of Systems: Changes? :Yes    in PT for her back.   Past medications for mental health diagnoses include: Depakote, Celexa, Wellbutrin, Klonopin, Prozac, Zoloft, Lexapro, Adderall  Allergies: Codeine  Current Medications:  Current Outpatient Medications:    ALPRAZolam (XANAX) 0.5 MG tablet, Take 0.5-1 tablets (0.25-0.5 mg total) by mouth 2 (two) times  daily as needed for anxiety., Disp: 30 tablet, Rfl: 1   buPROPion (WELLBUTRIN XL) 300 MG 24 hr tablet, TAKE 1 TABLET BY MOUTH EVERY DAY, Disp: 90 tablet, Rfl: 0   citalopram (CELEXA) 10 MG tablet, TAKE 1 TABLET BY MOUTH EVERY DAY, Disp: 90 tablet, Rfl: 0   divalproex (DEPAKOTE ER) 500 MG 24 hr tablet, TAKE 2 TABLETS BY MOUTH EVERY DAY AT BEDTIME, Disp: 180 tablet, Rfl: 1   fluticasone (FLONASE) 50 MCG/ACT nasal spray, Two sprays each nostril once a day as needed for nasal congestion or drainage., Disp: 16 g, Rfl: 5   levothyroxine (SYNTHROID) 75 MCG tablet, Take 75 mcg by mouth daily before breakfast., Disp: , Rfl: 2   LINZESS 145 MCG CAPS capsule, Take 145 mcg by mouth daily before breakfast., Disp: , Rfl:    ondansetron (ZOFRAN) 4 MG tablet, Take 1 tablet (4 mg total) by mouth every 8 (eight) hours as needed for nausea or vomiting., Disp: 20 tablet, Rfl: 0   solifenacin (VESICARE) 5 MG tablet, Take 5 mg by mouth daily., Disp: , Rfl:    albuterol (PROVENTIL HFA;VENTOLIN HFA) 108 (90 Base) MCG/ACT inhaler, Inhale 2 puffs into the lungs every 4 (four) hours as needed for wheezing or shortness of breath. (Patient not taking: Reported on 01/02/2023), Disp: 18 g, Rfl: 1   amphetamine-dextroamphetamine (ADDERALL) 30 MG tablet, Take 1 tablet by mouth 2 (two) times daily., Disp: 60 tablet, Rfl: 0   budesonide-formoterol (SYMBICORT) 80-4.5 MCG/ACT inhaler, Inhale 2 puffs into the lungs 2 (two) times daily. USE SPACER. (Patient not taking: Reported on 01/02/2023), Disp: 1 Inhaler, Rfl: 5   gabapentin (NEURONTIN) 600 MG  tablet, Take 1 tablet (600 mg total) by mouth 2 (two) times daily., Disp: 180 tablet, Rfl: 1   lamoTRIgine (LAMICTAL) 100 MG tablet, Take 1 tablet (100 mg total) by mouth daily., Disp: 90 tablet, Rfl: 1 Medication Side Effects: sexual dysfunction  Family Medical/ Social History: Changes?  None  MENTAL HEALTH EXAM:  Last menstrual period 12/14/2018.There is no height or weight on file to  calculate BMI.  General Appearance: Casual and Well Groomed  Eye Contact:  Good  Speech:  Clear and Coherent and Normal Rate  Volume:  Normal  Mood:  Euthymic  Affect:  Congruent  Thought Process:  Goal Directed and Descriptions of Associations: Circumstantial  Orientation:  Full (Time, Place, and Person)  Thought Content: Logical   Suicidal Thoughts:  No  Homicidal Thoughts:  No  Memory:  WNL  Judgement:  Good  Insight:  Good  Psychomotor Activity:  Normal  Concentration:  Concentration: Good and Attention Span: Good  Recall:  Good  Fund of Knowledge: Good  Language: Good  Assets:  Desire for Improvement  ADL's:  Intact  Cognition: WNL  Prognosis:  Good   Labs from [facility] on 02/09/2023 are listed under Care Everywhere. Pertinent levels CBC within normal limits including platelets CMP all normal including LFTs Lipid panel total cholesterol 213, triglycerides 215, HDL 97, LDL 73 TSH 2.7 Depakote level 68   DIAGNOSES:    ICD-10-CM   1. Bipolar I disorder ([place])  F31.9     2. Attention deficit hyperactivity disorder (ADHD), predominantly inattentive type  F90.0     3. PTSD (post-traumatic stress disorder)  F43.10     4. Generalized anxiety disorder  F41.1      Receiving Psychotherapy: No   RECOMMENDATIONS:  PDMP was reviewed. Last Adderall filled 01/02/2023.  Last Xanax filled 01/02/2023.  Gabapentin filled 12/30/2022. I provided 20 minutes of face to face time during this encounter, including time spent before and after the visit in records review, medical decision making, counseling pertinent to today's visit, and charting.   We discussed the weight gain.  She was underweight so gaining a few pounds has not been bad, however neither of [place] want her to continue gaining.  Recommend increased exercise and low carb diet.  Also remember to take the Adderall in the afternoons which she does forget sometimes, that will help curb appetite some and also help  with afternoon focus as well as energy.  Continue Xanax 0.5 mg, 1/2-1 p.o. twice daily as needed.  Use sparingly. Do not take with Adderall.  Continue Adderall 30 mg, 1 p.o. every morning and 1 p.o. q. Afternoon prn. Continue Wellbutrin XL 300 mg, 1 p.o. every morning. Coninue Celexa 10 mg, 1 p.o. daily. Continue Depakote ER 500 mg, 2 p.o. nightly. Continue gabapentin 600 mg, 1 p.o. twice daily. Continue Lamictal 100 mg, 1 p.o. daily. Recommend therapy. Return in 6 weeks.  Donnal Moat, PA-C

## 2023-03-12 ENCOUNTER — Other Ambulatory Visit: Payer: Self-pay | Admitting: Physician Assistant

## 2023-04-15 ENCOUNTER — Other Ambulatory Visit: Payer: Self-pay | Admitting: Physician Assistant

## 2023-04-17 ENCOUNTER — Telehealth: Payer: Self-pay | Admitting: Physician Assistant

## 2023-04-17 ENCOUNTER — Other Ambulatory Visit: Payer: Self-pay

## 2023-04-17 MED ORDER — AMPHETAMINE-DEXTROAMPHETAMINE 30 MG PO TABS: 30.0000 mg | ORAL_TABLET | Freq: Two times a day (BID) | ORAL | 0 refills | Status: DC

## 2023-04-17 NOTE — Telephone Encounter (Signed)
Pended.

## 2023-04-17 NOTE — Telephone Encounter (Signed)
Kelli Pennington called at 10:27 to request refill of her Adderall.  Appt 04/20/23.  [facility] [address], [place], [place].  She said they have it.

## 2023-04-20 ENCOUNTER — Telehealth (INDEPENDENT_AMBULATORY_CARE_PROVIDER_SITE_OTHER): Payer: 59 | Admitting: Physician Assistant

## 2023-04-20 ENCOUNTER — Encounter: Payer: Self-pay | Admitting: Physician Assistant

## 2023-04-20 DIAGNOSIS — F411 Generalized anxiety disorder: Secondary | ICD-10-CM

## 2023-04-20 DIAGNOSIS — F319 Bipolar disorder, unspecified: Secondary | ICD-10-CM

## 2023-04-20 DIAGNOSIS — F900 Attention-deficit hyperactivity disorder, predominantly inattentive type: Secondary | ICD-10-CM

## 2023-04-20 MED ORDER — AMPHETAMINE-DEXTROAMPHETAMINE 30 MG PO TABS: 30.0000 mg | ORAL_TABLET | Freq: Two times a day (BID) | ORAL | 0 refills | Status: DC

## 2023-04-20 NOTE — Progress Notes (Signed)
[facility]  Patient ID: Kelli Pennington,  MRN: [id number]  PCP: Linus Galas, NP  Date of Evaluation: 04/20/2023 Time spent:20 minutes  Chief Complaint:  Chief Complaint   Anxiety; ADD; Depression; Follow-up   Virtual Visit via Telehealth  I connected with patient by a video enabled telemedicine application with their informed consent, and verified patient privacy and that I am speaking with the correct person using two identifiers.  I am private, in my office and the patient is at home.  I discussed the limitations, risks, security and privacy concerns of performing an evaluation and management service by video and the availability of in person appointments. I also discussed with the patient that there may be a patient responsible charge related to this service. The patient expressed understanding and agreed to proceed.   I discussed the assessment and treatment plan with the patient. The patient was provided an opportunity to ask questions and all were answered. The patient agreed with the plan and demonstrated an understanding of the instructions.   The patient was advised to call back or seek an in-person evaluation if the symptoms worsen or if the condition fails to improve as anticipated.  I provided 20  minutes of non-face-to-face time during this encounter.  HISTORY/CURRENT STATUS: HPI for routine med check  Lost her job last month. Her husband also lost his. She's not really worried about it, her job was really toxic, she knows she'll be able to find a new job soon.   Celexa is still causing wt gain, she can't handle that, it makes her depressed.   Feels like her meds are working well. Patient is able to enjoy things.  Energy and motivation are good.  No extreme sadness, tearfulness, or feelings of hopelessness.  Sleeps well most of the time. ADLs and personal hygiene are normal.  Denies suicidal or homicidal thoughts.  States that attention is good without easy  distractibility.  Able to focus on things and finish tasks to completion.   Patient denies increased energy with decreased need for sleep, increased talkativeness, racing thoughts, impulsivity or risky behaviors, increased spending, increased libido, grandiosity, increased irritability or anger, paranoia, or hallucinations.  Denies dizziness, syncope, seizures, numbness, tingling, tremor, tics, unsteady gait, slurred speech, confusion. Denies muscle or joint pain, stiffness, or dystonia.  Individual Medical History/ Review of Systems: Changes? :Yes    still gaining weight  Past medications for mental health diagnoses include: Depakote, Celexa, Wellbutrin, Klonopin, Prozac, Zoloft, Lexapro, Adderall  Allergies: Codeine  Current Medications:  Current Outpatient Medications:    albuterol (PROVENTIL HFA;VENTOLIN HFA) 108 (90 Base) MCG/ACT inhaler, Inhale 2 puffs into the lungs every 4 (four) hours as needed for wheezing or shortness of breath., Disp: 18 g, Rfl: 1   ALPRAZolam (XANAX) 0.5 MG tablet, Take 0.5-1 tablets (0.25-0.5 mg total) by mouth 2 (two) times daily as needed for anxiety., Disp: 30 tablet, Rfl: 1   [START ON 05/20/2023] amphetamine-dextroamphetamine (ADDERALL) 30 MG tablet, Take 1 tablet by mouth 2 (two) times daily., Disp: 60 tablet, Rfl: 0   amphetamine-dextroamphetamine (ADDERALL) 30 MG tablet, Take 1 tablet by mouth 2 (two) times daily., Disp: 60 tablet, Rfl: 0   buPROPion (WELLBUTRIN XL) 300 MG 24 hr tablet, TAKE 1 TABLET BY MOUTH EVERY DAY, Disp: 90 tablet, Rfl: 0   citalopram (CELEXA) 10 MG tablet, TAKE 1 TABLET BY MOUTH EVERY DAY, Disp: 90 tablet, Rfl: 0   divalproex (DEPAKOTE ER) 500 MG 24 hr tablet, TAKE 2 TABLETS BY MOUTH  EVERY DAY AT BEDTIME, Disp: 180 tablet, Rfl: 1   fluticasone (FLONASE) 50 MCG/ACT nasal spray, Two sprays each nostril once a day as needed for nasal congestion or drainage., Disp: 16 g, Rfl: 5   gabapentin (NEURONTIN) 600 MG tablet, Take 1 tablet (600 mg  total) by mouth 2 (two) times daily., Disp: 180 tablet, Rfl: 1   lamoTRIgine (LAMICTAL) 100 MG tablet, Take 1 tablet (100 mg total) by mouth daily., Disp: 90 tablet, Rfl: 1   levothyroxine (SYNTHROID) 75 MCG tablet, Take 75 mcg by mouth daily before breakfast., Disp: , Rfl: 2   LINZESS 145 MCG CAPS capsule, Take 145 mcg by mouth daily before breakfast., Disp: , Rfl:    ondansetron (ZOFRAN) 4 MG tablet, Take 1 tablet (4 mg total) by mouth every 8 (eight) hours as needed for nausea or vomiting., Disp: 20 tablet, Rfl: 0   solifenacin (VESICARE) 5 MG tablet, Take 5 mg by mouth daily., Disp: , Rfl:    [START ON 06/19/2023] amphetamine-dextroamphetamine (ADDERALL) 30 MG tablet, Take 1 tablet by mouth 2 (two) times daily., Disp: 60 tablet, Rfl: 0   budesonide-formoterol (SYMBICORT) 80-4.5 MCG/ACT inhaler, Inhale 2 puffs into the lungs 2 (two) times daily. USE SPACER. (Patient not taking: Reported on 01/02/2023), Disp: 1 Inhaler, Rfl: 5 Medication Side Effects: weight gain  Family Medical/ Social History: Changes?  She lost her job last month, her husband lost his this week.  MENTAL HEALTH EXAM:  Last menstrual period 12/14/2018.There is no height or weight on file to calculate BMI.  General Appearance: Casual and Well Groomed  Eye Contact:  Good  Speech:  Clear and Coherent and Normal Rate  Volume:  Normal  Mood:  Euthymic  Affect:  Congruent  Thought Process:  Goal Directed and Descriptions of Associations: Circumstantial  Orientation:  Full (Time, Place, and Person)  Thought Content: Logical   Suicidal Thoughts:  No  Homicidal Thoughts:  No  Memory:  WNL  Judgement:  Good  Insight:  Good  Psychomotor Activity:  Normal  Concentration:  Concentration: Good and Attention Span: Good  Recall:  Good  Fund of Knowledge: Good  Language: Good  Assets:  Desire for Improvement  ADL's:  Intact  Cognition: WNL  Prognosis:  Good   DIAGNOSES:    ICD-10-CM   1. Bipolar I disorder ([facility])  F31.9      2. Attention deficit hyperactivity disorder (ADHD), predominantly inattentive type  F90.0     3. Generalized anxiety disorder  F41.1       Receiving Psychotherapy: No   RECOMMENDATIONS:  PDMP was reviewed. Last Adderall filled 03/08/2023.  Last Xanax filled 03/16/2023.  Gabapentin filled 03/29/2023. I provided 20 minutes of non-face-to-face time during this encounter, including time spent before and after the visit in records review, medical decision making, counseling pertinent to today's visit, and charting.   Disc her wt. She's not depressed or extremely anxious and wants to go off the Celexa since it's causing wt gain. She'd like to stop it. That's fine but call if anxiety or depression recur.   Wean off Celexa by taking 5 mg daily for 1 week and then stop.  If she feels dizzy or has brain zaps after stopping then she could go down to 2.5 mg daily for a week and then stop.  If she has any problems with that she can call.  Continue Xanax 0.5 mg, 1/2-1 p.o. twice daily as needed.  Use sparingly. Do not take with Adderall.  Continue Adderall  30 mg, 1 p.o. every morning and 1 p.o. q. Afternoon prn. Continue Wellbutrin XL 300 mg, 1 p.o. every morning. Continue Depakote ER 500 mg, 2 p.o. nightly. Continue gabapentin 600 mg, 1 p.o. twice daily. Return in 6 months.  Melony Overly, PA-C

## 2023-05-22 ENCOUNTER — Other Ambulatory Visit: Payer: Self-pay | Admitting: Orthopedic Surgery

## 2023-05-22 DIAGNOSIS — M259 Joint disorder, unspecified: Secondary | ICD-10-CM

## 2023-06-02 ENCOUNTER — Other Ambulatory Visit: Payer: Self-pay | Admitting: Orthopedic Surgery

## 2023-06-02 DIAGNOSIS — M259 Joint disorder, unspecified: Secondary | ICD-10-CM

## 2023-06-06 ENCOUNTER — Other Ambulatory Visit: Payer: Self-pay | Admitting: Physician Assistant

## 2023-06-13 ENCOUNTER — Other Ambulatory Visit: Payer: Self-pay | Admitting: Orthopedic Surgery

## 2023-06-13 DIAGNOSIS — M259 Joint disorder, unspecified: Secondary | ICD-10-CM

## 2023-06-14 ENCOUNTER — Other Ambulatory Visit: Payer: 59

## 2023-06-14 ENCOUNTER — Inpatient Hospital Stay: Admission: RE | Admit: 2023-06-14 | Payer: 59 | Source: Ambulatory Visit

## 2023-06-14 ENCOUNTER — Ambulatory Visit
Admission: RE | Admit: 2023-06-14 | Discharge: 2023-06-14 | Disposition: A | Discharge status: Home / Self Care | Payer: Medicaid Other | Source: Ambulatory Visit | Attending: Orthopedic Surgery | Admitting: Orthopedic Surgery

## 2023-06-14 DIAGNOSIS — M259 Joint disorder, unspecified: Secondary | ICD-10-CM

## 2023-06-14 MED ORDER — METHYLPREDNISOLONE ACETATE 40 MG/ML INJ SUSP (RADIOLOG
120.0000 mg | Freq: Once | INTRAMUSCULAR | Status: AC
  Administered 2023-06-14: 120 mg via INTRA_ARTICULAR

## 2023-06-27 ENCOUNTER — Other Ambulatory Visit: Payer: Self-pay | Admitting: Physician Assistant

## 2023-07-07 ENCOUNTER — Ambulatory Visit
Admission: RE | Admit: 2023-07-07 | Discharge: 2023-07-07 | Disposition: A | Discharge status: Home / Self Care | Payer: Medicaid Other | Source: Ambulatory Visit | Attending: Orthopedic Surgery | Admitting: Orthopedic Surgery

## 2023-07-07 ENCOUNTER — Other Ambulatory Visit: Payer: Medicaid Other

## 2023-07-07 DIAGNOSIS — M259 Joint disorder, unspecified: Secondary | ICD-10-CM

## 2023-07-13 ENCOUNTER — Telehealth: Payer: Self-pay | Admitting: Physician Assistant

## 2023-07-13 NOTE — Telephone Encounter (Signed)
Pt is scheduled for 9/5.

## 2023-07-13 NOTE — Telephone Encounter (Signed)
Ok to restart Celexa at  10 mg daily. If she needs a RF, please pend for me. Have her make a f/u appt in about 6 weeks. Thanks.

## 2023-07-13 NOTE — Telephone Encounter (Signed)
Kelli Pennington called at 2:35 to report that she needs to be put back on Celexa..  She has some 10mg  left over, but she would like to discuss that it is ok for her to restart and how to restart the medication.  Please call.  No appt. Due back 11/24

## 2023-07-13 NOTE — Telephone Encounter (Signed)
Patient does not need a RF currently, will let us know when needed. Asked admin to schedule FU.

## 2023-07-13 NOTE — Telephone Encounter (Signed)
Patient reported she stopped Celexa d/t weight gain. She said she needs to restart it. Reported being irritable and "pissed off at everything."  Weaned off in May.  Was on 10 mg.   [facility] [place].

## 2023-07-13 NOTE — Telephone Encounter (Signed)
Please call to schedule FU in approximately 6 weeks per TH.

## 2023-07-18 ENCOUNTER — Other Ambulatory Visit: Payer: Self-pay | Admitting: Physician Assistant

## 2023-07-28 ENCOUNTER — Telehealth: Payer: Self-pay | Admitting: Physician Assistant

## 2023-07-28 NOTE — Telephone Encounter (Signed)
Pt lvm that she started a new medication and she is feeling weird and strange. Please call her at [phone]

## 2023-07-28 NOTE — Telephone Encounter (Signed)
Patient reports feeling like she has slurred speech, she can't spell, and is wobbly. I am not that familiar with patient but her speech did not sound slurred to me, but she did report it was better now. She was restarted on Celexa 10 mg on 7/31, no other new medications. She doesn't know if it is the Celexa. She has been on it previously. Discussed that meds sometimes have SE that resolve with continued use. Told if sx progress to see care at ER. Told her I would FU with her on Monday to see how she was doing.

## 2023-07-31 NOTE — Telephone Encounter (Signed)
Called patient to FU. She reported she was taking Celexa with food, but she was taking it about an hour before she ate. She is now taking with a meal and she is doing much better.

## 2023-07-31 NOTE — Telephone Encounter (Signed)
Noted  

## 2023-07-31 NOTE — Telephone Encounter (Signed)
Yes, please check on her. If SE are still there but not worse and didn't have to go to the ER, have her decrease Celexa to 5 mg. Thanks

## 2023-08-24 ENCOUNTER — Ambulatory Visit (INDEPENDENT_AMBULATORY_CARE_PROVIDER_SITE_OTHER): Payer: 59 | Admitting: Physician Assistant

## 2023-08-24 ENCOUNTER — Encounter: Payer: Self-pay | Admitting: Physician Assistant

## 2023-08-24 DIAGNOSIS — F4310 Post-traumatic stress disorder, unspecified: Secondary | ICD-10-CM | POA: Diagnosis not present

## 2023-08-24 DIAGNOSIS — F900 Attention-deficit hyperactivity disorder, predominantly inattentive type: Secondary | ICD-10-CM

## 2023-08-24 DIAGNOSIS — R5381 Other malaise: Secondary | ICD-10-CM

## 2023-08-24 DIAGNOSIS — Z79899 Other long term (current) drug therapy: Secondary | ICD-10-CM

## 2023-08-24 DIAGNOSIS — F319 Bipolar disorder, unspecified: Secondary | ICD-10-CM

## 2023-08-24 DIAGNOSIS — F411 Generalized anxiety disorder: Secondary | ICD-10-CM | POA: Diagnosis not present

## 2023-08-24 DIAGNOSIS — R5383 Other fatigue: Secondary | ICD-10-CM

## 2023-08-24 MED ORDER — AMPHETAMINE-DEXTROAMPHETAMINE 30 MG PO TABS: 30.0000 mg | ORAL_TABLET | Freq: Two times a day (BID) | ORAL | 0 refills | Status: AC

## 2023-08-24 MED ORDER — GABAPENTIN 600 MG PO TABS: 600.0000 mg | ORAL_TABLET | Freq: Two times a day (BID) | ORAL | 1 refills | Status: AC

## 2023-08-24 MED ORDER — BUPROPION HCL ER (XL) 300 MG PO TB24: 300.0000 mg | ORAL_TABLET | Freq: Every day | ORAL | 0 refills | Status: AC

## 2023-08-24 MED ORDER — ALPRAZOLAM 0.5 MG PO TABS: 0.2500 mg | ORAL_TABLET | Freq: Two times a day (BID) | ORAL | 1 refills | Status: AC | PRN

## 2023-08-24 NOTE — Progress Notes (Signed)
[facility]  Patient ID: Kelli Pennington,  MRN: [id number]  PCP: Linus Galas, NP  Date of Evaluation: 08/24/2023 Time spent: 34 minutes  Chief Complaint:  Chief Complaint   Anxiety; Depression; ADD; Follow-up    HISTORY/CURRENT STATUS: HPI for routine med check  She called since the LOV and we restarted Celexa. She hasn't been taking it regularly.  Forgets. Doesn't skip over 1 day at a time.   Is more anxious, she and husband will be moving soon to [facility] area. Has been taking the Xanax more often. Sometimes once a day, when it used to be a few times per week max.  Patient is able to enjoy things.  Energy and motivation are good.  Not working right now.   No extreme sadness, tearfulness, or feelings of hopelessness.  Sleeps well most of the time. ADLs and personal hygiene are normal. Appetite has not changed.  Weight is stable.  Denies suicidal or homicidal thoughts.  Still having a hard time remembering things. Loses her keys several times a day. Can't remember anything unless she writes it down.  She'll be thinking of smething but can't come up wit the word for it.  States that attention is good without easy distractibility.  Able to focus on things and finish tasks to completion.   Patient denies increased energy with decreased need for sleep, increased talkativeness, racing thoughts, impulsivity or risky behaviors, increased spending, increased libido, grandiosity, increased irritability or anger, paranoia, or hallucinations.  Denies dizziness, syncope, seizures, numbness, tingling, tremor, tics, unsteady gait, slurred speech, confusion. Denies muscle or joint pain, stiffness, or dystonia.  Individual Medical History/ Review of Systems: Changes? :No      Past medications for mental health diagnoses include: Depakote, Celexa, Wellbutrin, Klonopin, Prozac, Zoloft, Lexapro, Adderall  Allergies: Codeine  Current Medications:  Current Outpatient Medications:    albuterol  (PROVENTIL HFA;VENTOLIN HFA) 108 (90 Base) MCG/ACT inhaler, Inhale 2 puffs into the lungs every 4 (four) hours as needed for wheezing or shortness of breath., Disp: 18 g, Rfl: 1   citalopram (CELEXA) 10 MG tablet, TAKE 1 TABLET BY MOUTH EVERY DAY, Disp: 90 tablet, Rfl: 0   divalproex (DEPAKOTE ER) 500 MG 24 hr tablet, TAKE 2 TABLETS BY MOUTH AT BEDTIME, Disp: 180 tablet, Rfl: 1   fluticasone (FLONASE) 50 MCG/ACT nasal spray, Two sprays each nostril once a day as needed for nasal congestion or drainage., Disp: 16 g, Rfl: 5   lamoTRIgine (LAMICTAL) 100 MG tablet, Take 1 tablet (100 mg total) by mouth daily., Disp: 90 tablet, Rfl: 1   levothyroxine (SYNTHROID) 75 MCG tablet, Take 75 mcg by mouth daily before breakfast., Disp: , Rfl: 2   LINZESS 145 MCG CAPS capsule, Take 145 mcg by mouth daily before breakfast., Disp: , Rfl:    ondansetron (ZOFRAN) 4 MG tablet, Take 1 tablet (4 mg total) by mouth every 8 (eight) hours as needed for nausea or vomiting., Disp: 20 tablet, Rfl: 0   solifenacin (VESICARE) 5 MG tablet, Take 5 mg by mouth daily., Disp: , Rfl:    ALPRAZolam (XANAX) 0.5 MG tablet, Take 0.5-1 tablets (0.25-0.5 mg total) by mouth 2 (two) times daily as needed for anxiety., Disp: 30 tablet, Rfl: 1   amphetamine-dextroamphetamine (ADDERALL) 30 MG tablet, Take 1 tablet by mouth 2 (two) times daily., Disp: 60 tablet, Rfl: 0   amphetamine-dextroamphetamine (ADDERALL) 30 MG tablet, Take 1 tablet by mouth 2 (two) times daily., Disp: 60 tablet, Rfl: 0   amphetamine-dextroamphetamine (ADDERALL) 30  MG tablet, Take 1 tablet by mouth 2 (two) times daily., Disp: 60 tablet, Rfl: 0   budesonide-formoterol (SYMBICORT) 80-4.5 MCG/ACT inhaler, Inhale 2 puffs into the lungs 2 (two) times daily. USE SPACER. (Patient not taking: Reported on 01/02/2023), Disp: 1 Inhaler, Rfl: 5   buPROPion (WELLBUTRIN XL) 300 MG 24 hr tablet, Take 1 tablet (300 mg total) by mouth daily., Disp: 90 tablet, Rfl: 0   gabapentin (NEURONTIN)  600 MG tablet, Take 1 tablet (600 mg total) by mouth 2 (two) times daily., Disp: 180 tablet, Rfl: 1 Medication Side Effects: weight gain  Family Medical/ Social History: Changes?  Still not working. Her husband found a job in [place]. He's already there and comes home every other weekend. She'll be moving too, prob in the next few months.   MENTAL HEALTH EXAM:  Last menstrual period 12/14/2018.There is no height or weight on file to calculate BMI.  General Appearance: Casual and Well Groomed  Eye Contact:  Good  Speech:  Clear and Coherent and Normal Rate  Volume:  Normal  Mood:  Anxious  Affect:  Anxious  Thought Process:  Goal Directed and Descriptions of Associations: Circumstantial  Orientation:  Full (Time, Place, and Person)  Thought Content: Logical   Suicidal Thoughts:  No  Homicidal Thoughts:  No  Memory:  WNL  Judgement:  Good  Insight:  Good  Psychomotor Activity:  Normal  Concentration:  Concentration: Good and Attention Span: Good  Recall:  Good  Fund of Knowledge: Good  Language: Good  Assets:  Desire for Improvement  ADL's:  Intact  Cognition: WNL  Prognosis:  Good   DIAGNOSES:    ICD-10-CM   1. Bipolar I disorder ([facility])  F31.9 CBC with Differential/Platelet    Valproic acid level    Comprehensive metabolic panel    Ammonia    Lamotrigine level    B12 and Folate Panel    VITAMIN D 25 Hydroxy (Vit-D Deficiency, Fractures)    2. Attention deficit hyperactivity disorder (ADHD), predominantly inattentive type  F90.0     3. Generalized anxiety disorder  F41.1     4. PTSD (post-traumatic stress disorder)  F43.10     5. Encounter for long-term (current) use of medications  Z79.899 CBC with Differential/Platelet    Valproic acid level    Comprehensive metabolic panel    Ammonia    Lamotrigine level    B12 and Folate Panel    VITAMIN D 25 Hydroxy (Vit-D Deficiency, Fractures)    TSH    6. Malaise and fatigue  R53.81 B12 and Folate Panel   R53.83 VITAMIN D 25  Hydroxy (Vit-D Deficiency, Fractures)    TSH     Receiving Psychotherapy: No   RECOMMENDATIONS:  PDMP was reviewed. Last Adderall filled 07/05/2023.  I provided 35 minutes of face to face time during this encounter, including time spent before and after the visit in records review, medical decision making, counseling pertinent to today's visit, and charting.   We discussed his compliance with the Celexa and the importance of taking it daily.  This will help with the anxiety and depression, and possibly memory some as well once the anxiety is better controlled.  I do recommend checking labs to make sure nothing metabolically is going on.  Recommend she see Neurology for memory issues, but since she's moving soon, I'd like her to get established with new PCP, and they can refer her if appropriate.   No change in meds at this time.  I  think once the Celexa is once again effective then she will not need the Xanax as often or at all.  Continue Xanax 0.5 mg, 1/2-1 p.o. twice daily as needed.  Use sparingly. Do not take with Adderall.  Continue Adderall 30 mg, 1 p.o. every morning and 1 p.o. q. Afternoon prn. Continue Wellbutrin XL 300 mg, 1 p.o. every morning. Continue Depakote ER 500 mg, 2 p.o. nightly. Continue gabapentin 600 mg, 1 p.o. twice daily. Labs ordered as above. Return in 4 weeks.   Melony Overly, PA-C

## 2023-08-25 ENCOUNTER — Encounter: Payer: Self-pay | Admitting: Physician Assistant

## 2023-08-25 LAB — B12 AND FOLATE PANEL
Folate: 16.3 ng/mL
Vitamin B-12: 346 pg/mL (ref 200–1100)

## 2023-08-25 LAB — TSH: TSH: 2.02 m[IU]/L

## 2023-08-25 LAB — VITAMIN D 25 HYDROXY (VIT D DEFICIENCY, FRACTURES): Vit D, 25-Hydroxy: 20 ng/mL — ABNORMAL LOW (ref 30–100)

## 2023-08-25 NOTE — Progress Notes (Signed)
Holding for other results

## 2023-08-27 LAB — CBC WITH DIFFERENTIAL/PLATELET
Absolute Monocytes: 326 {cells}/uL (ref 200–950)
Basophils Absolute: 29 {cells}/uL (ref 0–200)
Basophils Relative: 0.6 %
Eosinophils Absolute: 67 {cells}/uL (ref 15–500)
Eosinophils Relative: 1.4 %
HCT: 36.9 % (ref 35.0–45.0)
Hemoglobin: 12.3 g/dL (ref 11.7–15.5)
Lymphs Abs: 2126 {cells}/uL (ref 850–3900)
MCH: 32.5 pg (ref 27.0–33.0)
MCHC: 33.3 g/dL (ref 32.0–36.0)
MCV: 97.4 fL (ref 80.0–100.0)
MPV: 11.3 fL (ref 7.5–12.5)
Monocytes Relative: 6.8 %
Neutro Abs: 2251 {cells}/uL (ref 1500–7800)
Neutrophils Relative %: 46.9 %
Platelets: 183 10*3/uL (ref 140–400)
RBC: 3.79 10*6/uL — ABNORMAL LOW (ref 3.80–5.10)
RDW: 12.9 % (ref 11.0–15.0)
Total Lymphocyte: 44.3 %
WBC: 4.8 10*3/uL (ref 3.8–10.8)

## 2023-08-27 LAB — COMPREHENSIVE METABOLIC PANEL
AG Ratio: 1.8 (calc) (ref 1.0–2.5)
ALT: 8 U/L (ref 6–29)
AST: 20 U/L (ref 10–35)
Albumin: 4.4 g/dL (ref 3.6–5.1)
Alkaline phosphatase (APISO): 46 U/L (ref 37–153)
BUN: 21 mg/dL (ref 7–25)
CO2: 25 mmol/L (ref 20–32)
Calcium: 9.2 mg/dL (ref 8.6–10.4)
Chloride: 104 mmol/L (ref 98–110)
Creat: 0.69 mg/dL (ref 0.50–1.03)
Globulin: 2.4 g/dL (ref 1.9–3.7)
Glucose, Bld: 87 mg/dL (ref 65–99)
Potassium: 4.4 mmol/L (ref 3.5–5.3)
Sodium: 140 mmol/L (ref 135–146)
Total Bilirubin: 0.2 mg/dL (ref 0.2–1.2)
Total Protein: 6.8 g/dL (ref 6.1–8.1)

## 2023-08-27 LAB — LAMOTRIGINE LEVEL: Lamotrigine Lvl: 8.3 ug/mL (ref 2.5–15.0)

## 2023-08-27 LAB — VALPROIC ACID LEVEL: Valproic Acid Lvl: 79.2 mg/L (ref 50.0–100.0)

## 2023-08-27 LAB — AMMONIA: Ammonia: 52 umol/L (ref <=72)

## 2023-08-27 NOTE — Progress Notes (Signed)
Please let her know CBC and CMP all nl.  Ammonia level is nl. TSH and B12/Folate all nl.  Depakote level is 70 which is good. Continue same dose. Lamictal level is good. Continue same dose. Vitamin D level is very low at 20. We want it at least 50. Needs to start Vitamin D 50,000 international units  q wk, plus OTC 2,000 units daily. Will recheck level in about 2 months. I'll disc w/ her further at next appt in Oct.  Please pend Rx for me. Thanks.

## 2023-08-30 ENCOUNTER — Other Ambulatory Visit: Payer: Self-pay

## 2023-08-30 MED ORDER — ERGOCALCIFEROL 1.25 MG (50000 UT) PO CAPS: 50000.0000 [IU] | ORAL_CAPSULE | ORAL | 0 refills | Status: AC

## 2023-09-21 ENCOUNTER — Ambulatory Visit: Payer: 59 | Admitting: Physician Assistant

## 2023-11-22 ENCOUNTER — Other Ambulatory Visit: Payer: Self-pay | Admitting: Physician Assistant
